# Patient Record
Sex: Female | Born: 1995 | Race: Black or African American | Hispanic: No | Marital: Single | Smoking: Never smoker
Health system: Southern US, Community
[De-identification: ages and names within clinical notes are randomized; demographics above are authoritative.]

## PROBLEM LIST (undated history)

## (undated) DIAGNOSIS — N390 Urinary tract infection, site not specified: Secondary | ICD-10-CM

## (undated) DIAGNOSIS — F419 Anxiety disorder, unspecified: Secondary | ICD-10-CM

## (undated) DIAGNOSIS — E559 Vitamin D deficiency, unspecified: Secondary | ICD-10-CM

## (undated) DIAGNOSIS — A749 Chlamydial infection, unspecified: Secondary | ICD-10-CM

## (undated) DIAGNOSIS — G4730 Sleep apnea, unspecified: Secondary | ICD-10-CM

## (undated) DIAGNOSIS — B009 Herpesviral infection, unspecified: Secondary | ICD-10-CM

## (undated) DIAGNOSIS — T7840XA Allergy, unspecified, initial encounter: Secondary | ICD-10-CM

## (undated) DIAGNOSIS — D649 Anemia, unspecified: Secondary | ICD-10-CM

## (undated) DIAGNOSIS — J45909 Unspecified asthma, uncomplicated: Secondary | ICD-10-CM

## (undated) DIAGNOSIS — F32A Depression, unspecified: Secondary | ICD-10-CM

## (undated) HISTORY — DX: Depression, unspecified: F32.A

## (undated) HISTORY — DX: Anemia, unspecified: D64.9

## (undated) HISTORY — DX: Vitamin D deficiency, unspecified: E55.9

## (undated) HISTORY — DX: Sleep apnea, unspecified: G47.30

## (undated) HISTORY — DX: Herpesviral infection, unspecified: B00.9

## (undated) HISTORY — DX: Allergy, unspecified, initial encounter: T78.40XA

## (undated) HISTORY — PX: LAPAROSCOPIC GASTRIC SLEEVE RESECTION: SHX5895

## (undated) HISTORY — DX: Chlamydial infection, unspecified: A74.9

## (undated) HISTORY — DX: Unspecified asthma, uncomplicated: J45.909

## (undated) HISTORY — PX: TONSILLECTOMY AND ADENOIDECTOMY: SUR1326

## (undated) HISTORY — DX: Anxiety disorder, unspecified: F41.9

---
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
## 2015-01-22 ENCOUNTER — Ambulatory Visit (INDEPENDENT_AMBULATORY_CARE_PROVIDER_SITE_OTHER): Payer: BLUE CROSS/BLUE SHIELD

## 2015-01-22 ENCOUNTER — Telehealth: Payer: Self-pay | Admitting: Physician Assistant

## 2015-01-22 ENCOUNTER — Ambulatory Visit (INDEPENDENT_AMBULATORY_CARE_PROVIDER_SITE_OTHER): Payer: BLUE CROSS/BLUE SHIELD | Admitting: Physician Assistant

## 2015-01-22 VITALS — BP 132/80 | HR 89 | Temp 97.9°F | Resp 18 | Ht 65.0 in | Wt 300.0 lb

## 2015-01-22 DIAGNOSIS — R0602 Shortness of breath: Secondary | ICD-10-CM

## 2015-01-22 DIAGNOSIS — Z825 Family history of asthma and other chronic lower respiratory diseases: Secondary | ICD-10-CM

## 2015-01-22 DIAGNOSIS — R0789 Other chest pain: Secondary | ICD-10-CM

## 2015-01-22 DIAGNOSIS — Z3200 Encounter for pregnancy test, result unknown: Secondary | ICD-10-CM

## 2015-01-22 LAB — POCT URINE PREGNANCY: Preg Test, Ur: NEGATIVE

## 2015-01-22 LAB — D-DIMER, QUANTITATIVE: D-Dimer, Quant: 0.27 ug/mL-FEU (ref 0.00–0.48)

## 2015-01-22 LAB — TROPONIN I: Troponin I: <0.01 ng/mL (ref <0.06)

## 2015-01-22 MED ORDER — ALBUTEROL SULFATE HFA 108 (90 BASE) MCG/ACT IN AERS: 2.0000 | INHALATION_SPRAY | RESPIRATORY_TRACT | Status: DC | PRN

## 2015-01-22 NOTE — Progress Notes (Signed)
Subjective:    Patient ID: Autumn Nunez, female    DOB: [birth year], 19 y.o.   MRN: [id number]  Chief Complaint  Patient presents with  . Chest Pain    since saturday   . Shortness of Breath   Patient Active Problem List   Diagnosis Date Noted  . Obesity, morbid, BMI 40.0-49.9 11/20/2013   Prior to Admission medications   Medication Sig Start Date End Date Taking? Authorizing Provider  etonogestrel (IMPLANON) 68 MG IMPL implant Inject 1 each into the skin once.   Yes Historical Provider, MD   Medications, allergies, past medical history, surgical history, family history, social history and problem list reviewed and updated.  HPI  19 yof with no significant pmh presents with chest tightness, sob.  Chest tightness started approx 36 hrs ago. Was gradual onset, 6/10. Has been steady since onset. When she was working last night at [employer] it worsened to 9/10. No radiation. No assoc palps, presyncope, syncope. Chest tightness worsens with deep breaths, with laughing, sneezing, etc.   Has had implanon approx 1-2 yrs. Denies recent immobilzation, unilateral leg pain, swelling. No hx clots or fam hx clots.   She has had similar chest tightness before in the spring. Thinks she may have allergies. Denies recent rhinorrhea but has had itchy/watery eyes. Has had mild non prod cough past couple wks. She has no hx asthma. Her dad has asthma. She is concerned she may also have asthma. Admits to freq sob episodes over past few months-year which last minutes at a time. Denies wheezing.   Denies hx anxiety, panic attacks. Does not feel that anxiety has anything to do with this episode. No sick contacts.   Review of Systems No fever, chills.     Objective:   Physical Exam  Constitutional: She is oriented to person, place, and time. She appears well-developed and well-nourished.  Non-toxic appearance. She does not have a sickly appearance. She does not appear ill. No distress.  BP 132/80 mmHg   Pulse 89  Temp(Src) 97.9 F (36.6 C) (Oral)  Resp 18  Ht  (1.651 m)  Wt 300 lb (136.079 kg)  BMI 49.92 kg/m2  SpO2 97%   HENT:  Right Ear: Tympanic membrane normal.  Left Ear: Tympanic membrane normal.  Nose: No mucosal edema or rhinorrhea.  Mouth/Throat: Uvula is midline and oropharynx is clear and moist. No oropharyngeal exudate, posterior oropharyngeal edema, posterior oropharyngeal erythema or tonsillar abscesses.  Cardiovascular: Normal rate, regular rhythm and normal heart sounds.  Exam reveals no gallop.   No murmur heard. Pulmonary/Chest: Effort normal and breath sounds normal. No accessory muscle usage. No tachypnea. No respiratory distress. She has no decreased breath sounds. She has no wheezes. She has no rhonchi. She has no rales. She exhibits no tenderness and no bony tenderness.  Musculoskeletal:       Right lower leg: She exhibits no tenderness and no swelling.       Left lower leg: She exhibits no tenderness and no swelling.  No leg swelling, pain, erythema.   Lymphadenopathy:       Head (right side): No submental, no submandibular and no tonsillar adenopathy present.       Head (left side): No submental, no submandibular and no tonsillar adenopathy present.    She has no cervical adenopathy.  Neurological: She is alert and oriented to person, place, and time.  Skin: Skin is warm and dry. No rash noted.  Psychiatric: She has a normal mood and  affect. Her speech is normal and behavior is normal.   Results for orders placed or performed in visit on 01/22/15  POCT urine pregnancy  Result Value Ref Range   Preg Test, Ur Negative    EKG read by Dr Patsy Lageropland. Findings: decreased voltage OW negative  [facility] reading (PRIMARY) by  Dr. Patsy Lageropland. CXR: Borderline enlarged heart size, otherwise normal.   Spirometry findings pre-neb:  FVC: 54% FEV1: 36% FEV1/FVC: 68%  Duoneb completed. Lung sounds unchanged post.   Spirometry findings post-neb: FVC: 96% FEV1:  73% FEV1/FVC: 76%     Assessment & Plan:   [age] yof with no significant pmh presents with chest tightness, sob.  Chest tightness or pressure - Plan: EKG 12-Lead, D-dimer, quantitative --doubt cardiac involvement with pts age, no fam hx, stable cp for 36 hrs with no ekg changes --> await troponin  --low risk for pe per wells criteria - as pt has implanon, has pleuritic cp, and is obese will get d-dimer --> await result --possible asthma component as below --cxr normal other than borderline enlarged heart --er instructions if worsens or no improvement  SOB (shortness of breath) - Plan: PFT PULM FXN SPIROMETRY (94010), D-dimer, quantitative Family history of asthma - Plan: PFT PULM FXN SPIROMETRY (94010) --obstructive patten on spirometry --post duoneb FEV1, FVC, FEV1/FVC all improved significantly, likely asthma --script given for albuterol inhaler prn --rtc 3-6 months for asthma management --er instructions if worsens or no improvement  Encounter for pregnancy test - Plan: POCT urine pregnancy --negative  --ok to pursue cta if needed if positive d-dimer  Donnajean Lopesodd M. Lak[place] Markgraf, PA-C Physician Assistant-Certified [facility]  01/22/2015 3:08 PM

## 2015-01-22 NOTE — Telephone Encounter (Signed)
Attempted to call pt. Left msg to call back. DDimer and troponin was normal.

## 2015-01-22 NOTE — Patient Instructions (Signed)
We are unsure of the exact cause of your chest pain and shortness of breath right now.  We drew two labs to make sure it is not a clot or anything to do with your heart, I will be in touch with you in a couple hours with those results.  Your ekg and chest xray looked normal, making heart an unlikely cause. Your breathing test showed that you may have some asthma, this may be causing your symptoms. Your breathing test was much better after the breathing treatment,.  Please use the albuterol inhaler at home up to every 4 hours as needed for shortness of breath. We would like to see you back in 3-6 months for further asthma management.  If this doesn't seem to improve your breathing please return to clinic for further evaluation. Please start taking a daily antihistamine like claritin or zyrtec in case allergies are playing a role.  Please go to the ED asap if the chest pain or breathing worsens or even persists.

## 2015-01-22 NOTE — Telephone Encounter (Signed)
Pt called back, reassuring results relayed to pt. She expressed understanding. Instructed again to go to ED if sx worsen.

## 2017-02-01 IMAGING — CR DG CHEST 2V
4 series · 4 of 4 positions shown · non-contrast
Comparison: None

CLINICAL DATA: Chest tightness or pressure, shortness of breath,
nonsmoker

EXAM:
CHEST  2 VIEW

[PA (1 of 2)]
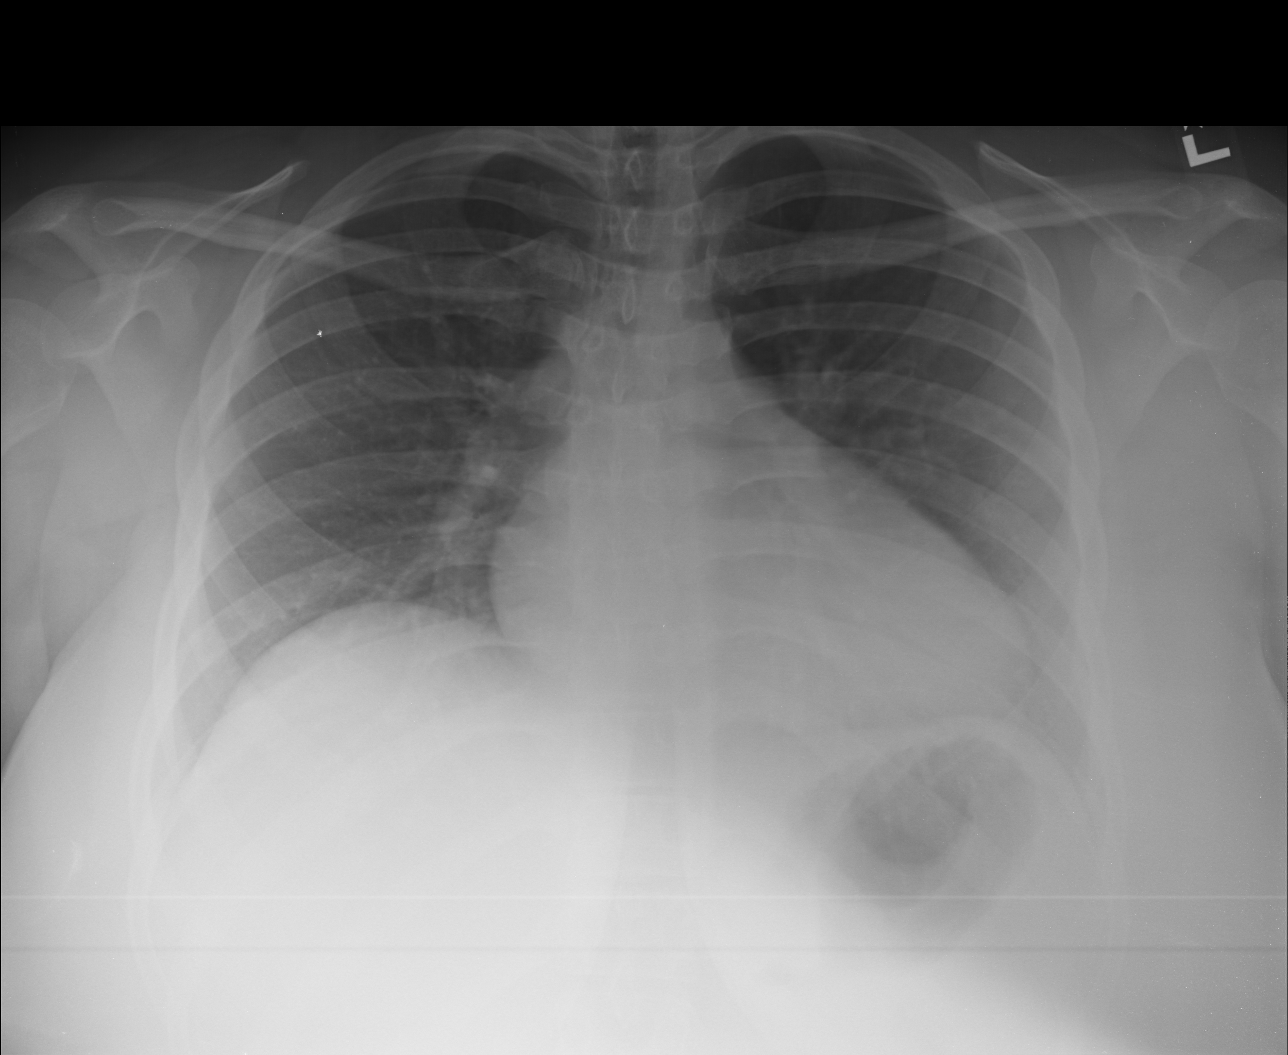

[lateral (1 of 2)]
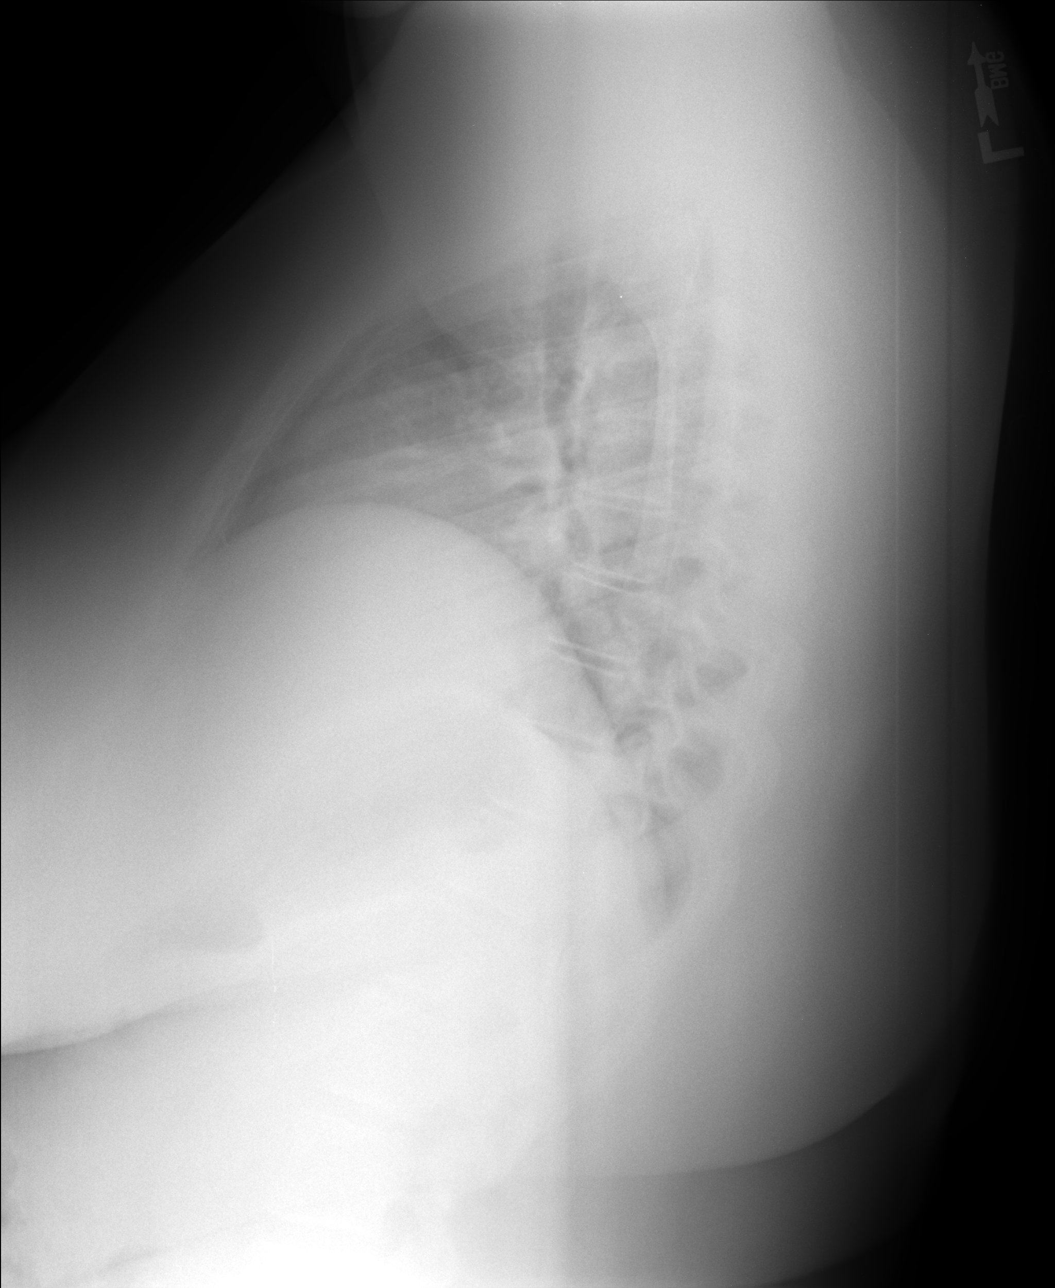

[lateral (2 of 2)]
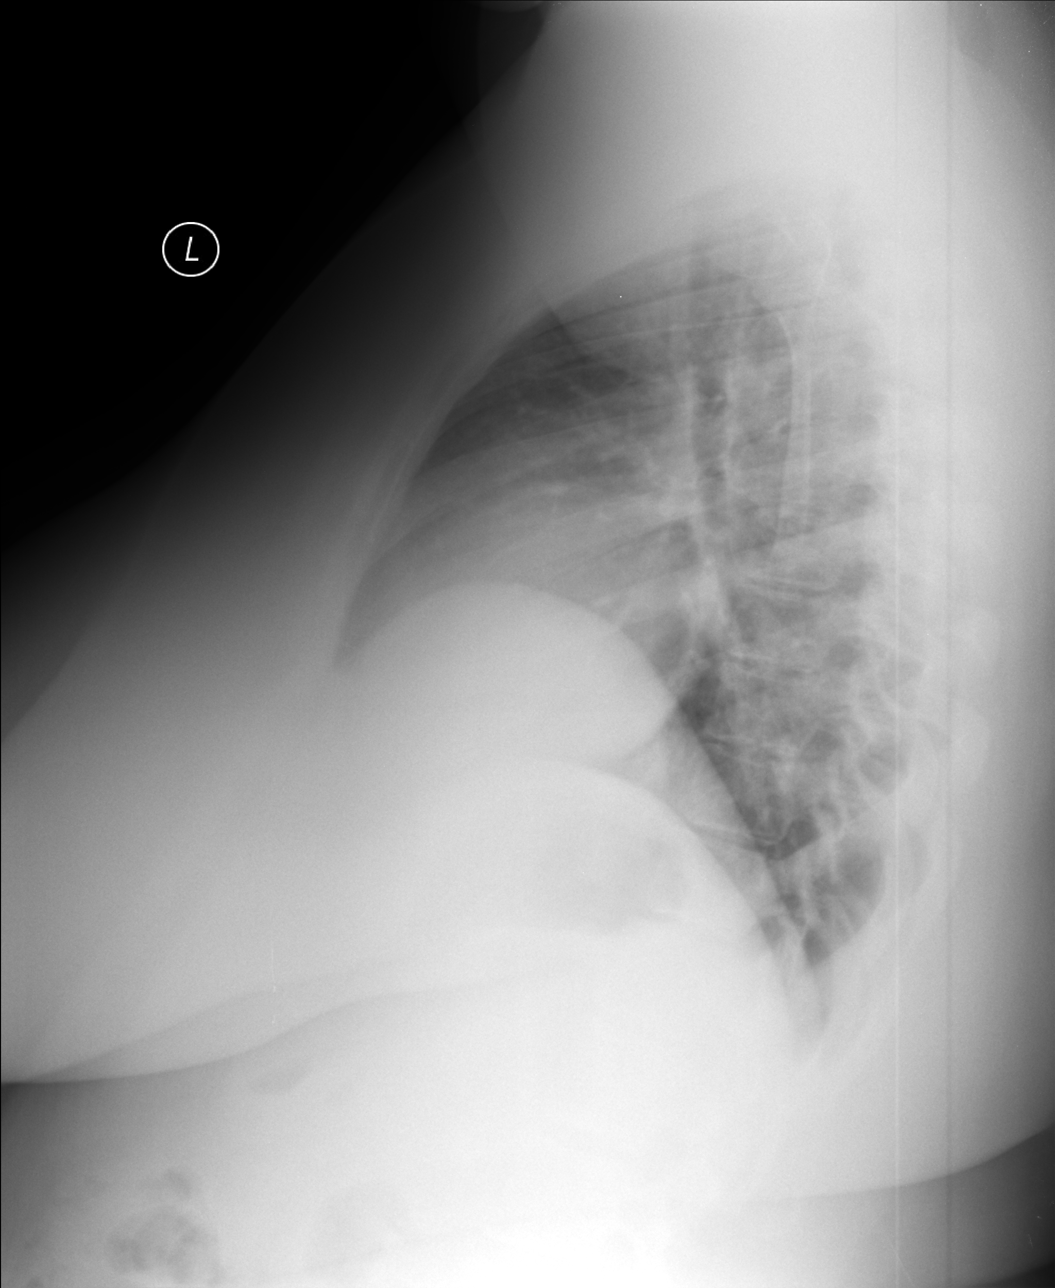

[PA (2 of 2)]
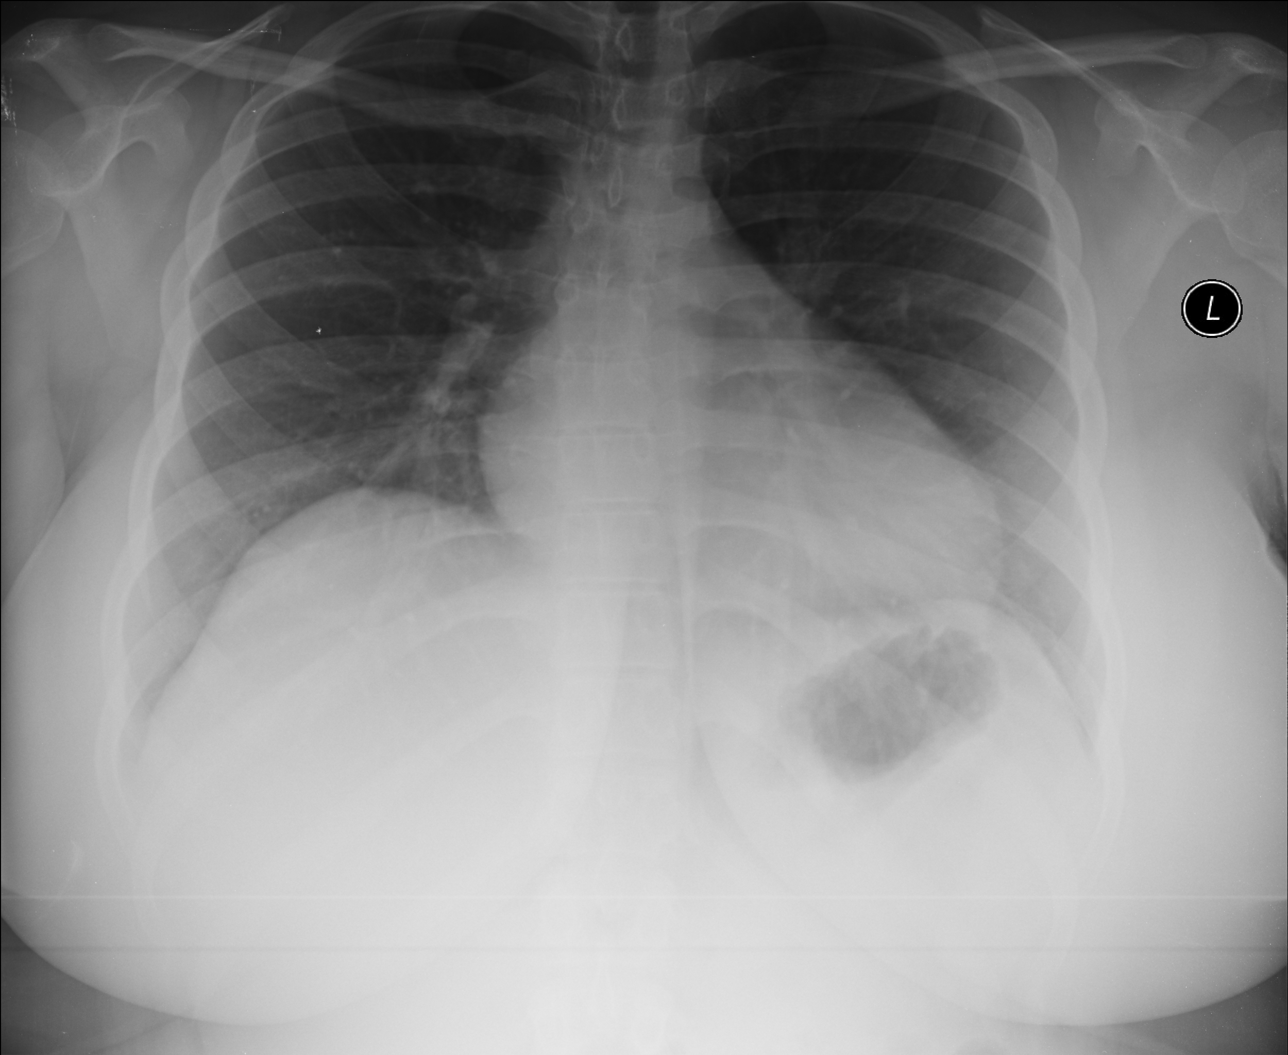

[4 of 4 positions shown; findings below may reference images not displayed]

FINDINGS: Enlargement of cardiac silhouette.

Mediastinal contours and pulmonary vascularity normal.

Lungs clear.

No pleural effusion or pneumothorax.

Bones unremarkable.
IMPRESSION: Enlargement of cardiac silhouette.

No acute abnormalities.

## 2018-02-02 ENCOUNTER — Encounter (HOSPITAL_COMMUNITY): Payer: Self-pay

## 2018-02-02 ENCOUNTER — Other Ambulatory Visit: Payer: Self-pay

## 2018-02-02 ENCOUNTER — Ambulatory Visit (HOSPITAL_COMMUNITY)
Admission: EM | Admit: 2018-02-02 | Discharge: 2018-02-02 | Disposition: A | Discharge status: Home / Self Care | Payer: Managed Care, Other (non HMO) | Attending: Family Medicine | Admitting: Family Medicine

## 2018-02-02 DIAGNOSIS — Z3202 Encounter for pregnancy test, result negative: Secondary | ICD-10-CM | POA: Diagnosis not present

## 2018-02-02 DIAGNOSIS — Z202 Contact with and (suspected) exposure to infections with a predominantly sexual mode of transmission: Secondary | ICD-10-CM | POA: Insufficient documentation

## 2018-02-02 DIAGNOSIS — Z113 Encounter for screening for infections with a predominantly sexual mode of transmission: Secondary | ICD-10-CM

## 2018-02-02 LAB — POCT PREGNANCY, URINE: Preg Test, Ur: NEGATIVE

## 2018-02-02 LAB — POCT URINALYSIS DIP (DEVICE)
BILIRUBIN URINE: NEGATIVE
GLUCOSE, UA: NEGATIVE mg/dL
KETONES UR: NEGATIVE mg/dL
Nitrite: POSITIVE — AB
Protein, ur: NEGATIVE mg/dL
Specific Gravity, Urine: 1.02 (ref 1.005–1.030)
Urobilinogen, UA: 0.2 mg/dL (ref 0.0–1.0)
pH: 6 (ref 5.0–8.0)

## 2018-02-02 NOTE — ED Triage Notes (Signed)
Pt presents with request for an STD screening to be completed. Denies any symptoms at this time.

## 2018-02-02 NOTE — ED Provider Notes (Addendum)
[facility]    CSN: [id number] Arrival date & time: 02/02/18  1010     History   Chief Complaint Chief Complaint  Patient presents with  . SEXUALLY TRANSMITTED DISEASE    HPI Autumn Nunez is a 22 y.o. female no significant past medical history presenting today requesting STD screen.  Her partner tested positive for chlamydia.  She denies any current symptoms at this time.  Denies vaginal discharge, pelvic pain, vaginal irritation or itching, denies urinary symptoms of dysuria, increased frequency, incomplete voiding.  Denies fever, abdominal pain, nausea, vomiting.  Patient has Nexplanon for birth control.  Denies any new rashes, lesions or bumps.  LMP last Monday, patient recently ended.  HPI  Past Medical History:  Diagnosis Date  . Allergy   . Recurrent UTI     Patient Active Problem List   Diagnosis Date Noted  . Obesity, morbid, BMI 40.0-49.9 (HCC) 11/20/2013    Past Surgical History:  Procedure Laterality Date  . BIRTH CONTROL IMPLANT  MARCH 2014  . CYSTO WITH HYDRODISTENSION N/A 04/08/2013   Procedure: CYSTOSCOPY/HYDRODISTENSION;  Surgeon: Kathi LudwigSigmund I Tannenbaum, MD;  [facility];  Service: Urology;  Laterality: N/A;  . CYSTOGRAM N/A 04/08/2013   Procedure: CYSTOGRAM;  Surgeon: Kathi LudwigSigmund I Tannenbaum, MD;  [facility];  Service: Urology;  Laterality: N/A;  . TONSILLECTOMY AND ADENOIDECTOMY  age 22    OB History    No data available       Home Medications    Prior to Admission medications   Medication Sig Start Date End Date Taking? Authorizing Provider  etonogestrel (IMPLANON) 68 MG IMPL implant Inject 1 each into the skin once.   Yes [provider]  albuterol (PROVENTIL HFA;VENTOLIN HFA) 108 (90 BASE) MCG/ACT inhaler Inhale 2 puffs into the lungs every 4 (four) hours as needed for wheezing or shortness of breath (cough, shortness of breath or wheezing.). 01/22/15   Raelyn EnsignMcVeigh, Todd, PA     Family History History reviewed. No pertinent family history.  Social History Social History   Tobacco Use  . Smoking status: Never Smoker  . Smokeless tobacco: Never Used  Substance Use Topics  . Alcohol use: No  . Drug use: No     Allergies   Patient has no known allergies.   Review of Systems Review of Systems  Constitutional: Negative for fever.  Respiratory: Negative for shortness of breath.   Cardiovascular: Negative for chest pain.  Gastrointestinal: Negative for abdominal pain, diarrhea, nausea and vomiting.  Genitourinary: Negative for dysuria, flank pain, genital sores, hematuria, menstrual problem, vaginal bleeding, vaginal discharge and vaginal pain.  Musculoskeletal: Negative for back pain.  Skin: Negative for rash.  Neurological: Negative for dizziness, light-headedness and headaches.     Physical Exam Triage Vital Signs ED Triage Vitals [02/02/18 1047]  Enc Vitals Group     BP (!) 148/116     Pulse Rate 83     Resp 19     Temp 98.6 F (37 C)     Temp src      SpO2 100 %     Weight      Height      Head Circumference      Peak Flow      Pain Score      Pain Loc      Pain Edu?      Excl. in GC?    No data found.  Updated Vital Signs BP (!) 148/116  Pulse 83   Temp 98.6 F (37 C)   Resp 19   LMP 02/02/2018   SpO2 100%   Visual Acuity Right Eye Distance:   Left Eye Distance:   Bilateral Distance:    Right Eye Near:   Left Eye Near:    Bilateral Near:     Physical Exam  Constitutional: She appears well-developed and well-nourished. No distress.  HENT:  Head: Normocephalic and atraumatic.  Eyes: Conjunctivae are normal.  Neck: Neck supple.  Cardiovascular: Normal rate and regular rhythm.  No murmur heard. Pulmonary/Chest: Effort normal and breath sounds normal. No respiratory distress.  Abdominal: Soft. There is no tenderness.  Genitourinary:  Genitourinary Comments: Pelvic exam deferred, patient without symptoms.   Musculoskeletal: She exhibits no edema.  Neurological: She is alert.  Skin: Skin is warm and dry.  Psychiatric: She has a normal mood and affect.  Nursing note and vitals reviewed.    UC Treatments / Results  Labs (all labs ordered are listed, but only abnormal results are displayed) Labs Reviewed  URINE CYTOLOGY ANCILLARY ONLY    EKG  EKG Interpretation None       Radiology No results found.  Procedures Procedures (including critical care time)  Medications Ordered in UC Medications - No data to display   Initial Impression / Assessment and Plan / UC Course  I have reviewed the triage vital signs and the nursing notes.  Pertinent labs & imaging results that were available during my care of the patient were reviewed by me and considered in my medical decision making (see chart for details).     Patient declines empiric treatment for chlamydia.  Will send off urine cytology, will call with any positive results and provide required medication. Discussed strict return precautions. Patient verbalized understanding and is agreeable with plan.   Final Clinical Impressions(s) / UC Diagnoses   Final diagnoses:  Screen for STD (sexually transmitted disease)    ED Discharge Orders    None       Controlled Substance Prescriptions [place] Controlled Substance Registry consulted? Not Applicable   Lew Dawes, PA-C 02/02/18 [address], [place], [place] 02/02/18 1102

## 2018-02-02 NOTE — Discharge Instructions (Signed)
We are testing you for Gonorrhea, Chlamydia, Trichomonas, Yeast and Bacterial Vaginosis. We will call you if anything is positive and let you know if you require any further treatment. Please inform partners of any positive results.   Please return if symptoms not improving with treatment, development of fever, nausea, vomiting, abdominal pain.  

## 2018-02-03 LAB — URINE CYTOLOGY ANCILLARY ONLY
Chlamydia: POSITIVE — AB
Neisseria Gonorrhea: NEGATIVE
TRICH (WINDOWPATH): NEGATIVE

## 2018-02-04 ENCOUNTER — Telehealth (HOSPITAL_COMMUNITY): Payer: Self-pay | Admitting: Family Medicine

## 2018-02-04 ENCOUNTER — Telehealth (HOSPITAL_COMMUNITY): Payer: Self-pay | Admitting: Internal Medicine

## 2018-02-04 LAB — URINE CYTOLOGY ANCILLARY ONLY: Candida vaginitis: NEGATIVE

## 2018-02-04 MED ORDER — AZITHROMYCIN 500 MG PO TABS: 1000.0000 mg | ORAL_TABLET | Freq: Once | ORAL | 0 refills | Status: AC

## 2018-02-04 NOTE — Telephone Encounter (Signed)
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  Prescription for zithromax sent to the pharmacy of record, [facility] on [address].  Please refrain from sexual intercourse for 7 days to give the medicine time to work.  Sexual partners need to be notified and tested/treated.  Condoms may reduce risk of reinfection.  Recheck for further evaluation if symptoms are not improving.   LM

## 2018-02-11 ENCOUNTER — Telehealth: Payer: Self-pay | Admitting: Internal Medicine

## 2018-10-01 ENCOUNTER — Encounter (HOSPITAL_BASED_OUTPATIENT_CLINIC_OR_DEPARTMENT_OTHER): Payer: Self-pay | Admitting: Adult Health

## 2018-10-01 ENCOUNTER — Other Ambulatory Visit: Payer: Self-pay

## 2018-10-01 ENCOUNTER — Emergency Department (HOSPITAL_BASED_OUTPATIENT_CLINIC_OR_DEPARTMENT_OTHER)
Admission: EM | Admit: 2018-10-01 | Discharge: 2018-10-01 | Disposition: A | Discharge status: Home / Self Care | Payer: Managed Care, Other (non HMO) | Attending: Emergency Medicine | Admitting: Emergency Medicine

## 2018-10-01 DIAGNOSIS — Z79899 Other long term (current) drug therapy: Secondary | ICD-10-CM | POA: Insufficient documentation

## 2018-10-01 DIAGNOSIS — L539 Erythematous condition, unspecified: Secondary | ICD-10-CM | POA: Diagnosis present

## 2018-10-01 DIAGNOSIS — L739 Follicular disorder, unspecified: Secondary | ICD-10-CM

## 2018-10-01 MED ORDER — DOXYCYCLINE HYCLATE 100 MG PO CAPS: 100.0000 mg | ORAL_CAPSULE | Freq: Two times a day (BID) | ORAL | 0 refills | Status: DC

## 2018-10-01 MED FILL — DOXYCYCLINE HYCLATE 100 MG: 100 | 7 days supply | Qty: 14 | Fill #0

## 2018-10-01 NOTE — ED Triage Notes (Addendum)
PResents with pustules and induration under bilateral axilla that have been ongoing for "Awhile" a few days ago she began developing pustules and bumps to breast, no induration noted. Denies drainage from nipples. Denies fevers.

## 2018-10-01 NOTE — ED Provider Notes (Signed)
[facility] EMERGENCY DEPARTMENT Provider Note   CSN: [id number] Arrival date & time: 10/01/18  1521     History   Chief Complaint Chief Complaint  Patient presents with  . Recurrent Skin Infections    HPI Autumn Nunez is a 22 y.o. female.  Autumn Nunez is a 22 y.o. Female who presents to the emergency department for evaluation of redness and pustules under her bilateral axillae that is been going on for a long time, as well as some redness and warmth noted to the right areole.  Patient reports that she has had issues with bumps in her armpits but for years now has never formally seen a doctor about them they frequently drain or improve on their own.  She reports several small ones in the right axilla, some of which drained last night, the left is actually significantly improved from usual, but she noticed some bumps over the hair follicles on her right areole on her breast and was concerned about this.  She has not noticed any drainage from these areas and the inflammation seems much more mild compared to that in her axilla.  She is not felt any masses in the breast.  She has not had any nipple discharge.  She is not currently breast-feeding.  No fevers or chills, no nausea or vomiting.  She has not done anything for symptoms prior to arrival, and denies any other aggravating or alleviating factors.     Past Medical History:  Diagnosis Date  . Allergy   . Recurrent UTI     Patient Active Problem List   Diagnosis Date Noted  . Obesity, morbid, BMI 40.0-49.9 (HCC) 11/20/2013    Past Surgical History:  Procedure Laterality Date  . BIRTH CONTROL IMPLANT  MARCH 2014  . CYSTO WITH HYDRODISTENSION N/A 04/08/2013   Procedure: CYSTOSCOPY/HYDRODISTENSION;  Surgeon: Kathi Ludwig, MD;  Location: [facility];  Service: Urology;  Laterality: N/A;  . CYSTOGRAM N/A 04/08/2013   Procedure: CYSTOGRAM;  Surgeon: Kathi Ludwig, MD;  [facility];  Service: Urology;  Laterality: N/A;  . TONSILLECTOMY AND ADENOIDECTOMY  age 22     OB History   None      Home Medications    Prior to Admission medications   Medication Sig Start Date End Date Taking? Authorizing Provider  albuterol (PROVENTIL HFA;VENTOLIN HFA) 108 (90 BASE) MCG/ACT inhaler Inhale 2 puffs into the lungs every 4 (four) hours as needed for wheezing or shortness of breath (cough, shortness of breath or wheezing.). 01/22/15   McVeigh, Tawanna Cooler, PA  doxycycline (VIBRAMYCIN) 100 MG capsule Take 1 capsule (100 mg total) by mouth 2 (two) times daily. One po bid x 7 days 10/01/18   Dartha Lodge, PA-C  etonogestrel (IMPLANON) 68 MG IMPL implant Inject 1 each into the skin once.    [provider]    Family History History reviewed. No pertinent family history.  Social History Social History   Tobacco Use  . Smoking status: Never Smoker  . Smokeless tobacco: Never Used  Substance Use Topics  . Alcohol use: No  . Drug use: No     Allergies   Patient has no known allergies.   Review of Systems Review of Systems  Constitutional: Negative for chills and fever.  Gastrointestinal: Negative for nausea and vomiting.  Skin: Positive for color change.     Physical Exam Updated Vital Signs BP (!) 152/79   Pulse 80  Temp 98.7 F (37.1 C) (Oral)   Resp 18   Ht 5\' 4"  (1.626 m)   Wt 127 kg   LMP 09/19/2018   SpO2 99%   BMI 48.06 kg/m   Physical Exam  Constitutional: She appears well-developed and well-nourished. No distress.  HENT:  Head: Normocephalic and atraumatic.  Eyes: Right eye exhibits no discharge. Left eye exhibits no discharge.  Pulmonary/Chest: Effort normal. No respiratory distress.  Musculoskeletal:  Several small pustules noted in bilateral axilla, left more severe than right, none large enough to warrant drainage, there is some surrounding erythema and induration with no areas of significant fluctuance. There is  some erythema and induration surrounding the hair follicles of the right areole on the right breast no palpable fluctuance or expressible drainage, no palpable breast masses, no nipple drainage or erythema.  Neurological: She is alert. Coordination normal.  Skin: Skin is warm and dry. Capillary refill takes less than 2 seconds. She is not diaphoretic.  Psychiatric: She has a normal mood and affect. Her behavior is normal.  Nursing note and vitals reviewed.    ED Treatments / Results  Labs (all labs ordered are listed, but only abnormal results are displayed) Labs Reviewed - No data to display  EKG None  Radiology No results found.  Procedures Procedures (including critical care time)  Medications Ordered in ED Medications - No data to display   Initial Impression / Assessment and Plan / ED Course  I have reviewed the triage vital signs and the nursing notes.  Pertinent labs & imaging results that were available during my care of the patient were reviewed by me and considered in my medical decision making (see chart for details).  Patient presents to the emergency department for evaluation of pustules erythema and induration in bilateral axilla as well as some erythema and induration over the health follicles on the right areaola.  She has had issues with this in her armpits for a long time but the area over her breast she just noticed a few days ago.  On exam this seems most consistent with folliculitis there are no areas of fluctuance or areas that appear large enough to require drainage.  Will treat with doxycycline and have patient follow-up closely with her primary care doctor.  She has no systemic symptoms and is well-appearing.  There is no palpable breast abscess.  Return precautions have been discussed.  Patient expresses understanding and is in agreement with plan.  Final Clinical Impressions(s) / ED Diagnoses   Final diagnoses:  Folliculitis    ED Discharge Orders          Ordered    doxycycline (VIBRAMYCIN) 100 MG capsule  2 times daily     10/01/18 1548           Jodi Geralds [place], [place] 10/01/18 1845    Loren Racer, MD 10/01/18 [phone]

## 2018-10-01 NOTE — Discharge Instructions (Signed)
Please take antibiotics as directed, keep the areas clean with gentle soaps you can also use warm compresses to help with discomfort, monitor the area for any worsening signs of infection such as increasing redness, swelling, warmth or drainage, fevers or chills or any other new or concerning symptoms.  If this is not resolving please follow-up with your primary care doctor or return to the emergency department.

## 2018-10-04 ENCOUNTER — Encounter (HOSPITAL_BASED_OUTPATIENT_CLINIC_OR_DEPARTMENT_OTHER): Payer: Self-pay | Admitting: *Deleted

## 2018-10-04 ENCOUNTER — Emergency Department (HOSPITAL_BASED_OUTPATIENT_CLINIC_OR_DEPARTMENT_OTHER)
Admission: EM | Admit: 2018-10-04 | Discharge: 2018-10-04 | Disposition: A | Discharge status: Home / Self Care | Payer: Managed Care, Other (non HMO) | Attending: Emergency Medicine | Admitting: Emergency Medicine

## 2018-10-04 ENCOUNTER — Other Ambulatory Visit: Payer: Self-pay

## 2018-10-04 DIAGNOSIS — Z79899 Other long term (current) drug therapy: Secondary | ICD-10-CM | POA: Diagnosis not present

## 2018-10-04 DIAGNOSIS — G43809 Other migraine, not intractable, without status migrainosus: Secondary | ICD-10-CM | POA: Insufficient documentation

## 2018-10-04 DIAGNOSIS — R51 Headache: Secondary | ICD-10-CM | POA: Diagnosis present

## 2018-10-04 LAB — COMPREHENSIVE METABOLIC PANEL
ALBUMIN: 3.3 g/dL — AB (ref 3.5–5.0)
ALK PHOS: 67 U/L (ref 38–126)
ALT: 14 U/L (ref 0–44)
AST: 14 U/L — AB (ref 15–41)
Anion gap: 8 (ref 5–15)
BUN: 13 mg/dL (ref 6–20)
CALCIUM: 8.9 mg/dL (ref 8.9–10.3)
CO2: 25 mmol/L (ref 22–32)
Chloride: 105 mmol/L (ref 98–111)
Creatinine, Ser: 0.64 mg/dL (ref 0.44–1.00)
GFR calc Af Amer: >60 mL/min (ref >60)
GFR calc non Af Amer: >60 mL/min (ref >60)
GLUCOSE: 85 mg/dL (ref 70–99)
Potassium: 3.5 mmol/L (ref 3.5–5.1)
SODIUM: 138 mmol/L (ref 135–145)
Total Bilirubin: 0.3 mg/dL (ref 0.3–1.2)
Total Protein: 7.3 g/dL (ref 6.5–8.1)

## 2018-10-04 LAB — PREGNANCY, URINE: Preg Test, Ur: NEGATIVE

## 2018-10-04 LAB — CBC WITH DIFFERENTIAL/PLATELET
ABS IMMATURE GRANULOCYTES: 0.01 10*3/uL (ref 0.00–0.07)
BASOS ABS: 0 10*3/uL (ref 0.0–0.1)
BASOS PCT: 0 %
Eosinophils Absolute: 0.2 10*3/uL (ref 0.0–0.5)
Eosinophils Relative: 3 %
HCT: 35 % — ABNORMAL LOW (ref 36.0–46.0)
HEMOGLOBIN: 10.3 g/dL — AB (ref 12.0–15.0)
Immature Granulocytes: 0 %
LYMPHS PCT: 52 %
Lymphs Abs: 3.9 10*3/uL (ref 0.7–4.0)
MCH: 23.9 pg — ABNORMAL LOW (ref 26.0–34.0)
MCHC: 29.4 g/dL — AB (ref 30.0–36.0)
MCV: 81.2 fL (ref 80.0–100.0)
Monocytes Absolute: 0.6 10*3/uL (ref 0.1–1.0)
Monocytes Relative: 8 %
NEUTROS ABS: 2.7 10*3/uL (ref 1.7–7.7)
NEUTROS PCT: 37 %
Platelets: 376 10*3/uL (ref 150–400)
RBC: 4.31 MIL/uL (ref 3.87–5.11)
RDW: 15.8 % — ABNORMAL HIGH (ref 11.5–15.5)
WBC: 7.5 10*3/uL (ref 4.0–10.5)
nRBC: 0 % (ref 0.0–0.2)

## 2018-10-04 MED ORDER — LACTATED RINGERS IV BOLUS
1000.0000 mL | Freq: Once | INTRAVENOUS | Status: AC
  Administered 2018-10-04: 1000 mL via INTRAVENOUS

## 2018-10-04 MED ORDER — DEXAMETHASONE SODIUM PHOSPHATE 10 MG/ML IJ SOLN
10.0000 mg | Freq: Once | INTRAMUSCULAR | Status: AC
  Administered 2018-10-04: 10 mg via INTRAVENOUS
  Filled 2018-10-04: qty 1

## 2018-10-04 MED ORDER — DIPHENHYDRAMINE HCL 50 MG/ML IJ SOLN
25.0000 mg | Freq: Once | INTRAMUSCULAR | Status: AC
  Administered 2018-10-04: 25 mg via INTRAVENOUS
  Filled 2018-10-04: qty 1

## 2018-10-04 MED ORDER — ONDANSETRON HCL 4 MG PO TABS: 4.0000 mg | ORAL_TABLET | Freq: Three times a day (TID) | ORAL | 0 refills | Status: DC | PRN

## 2018-10-04 MED ORDER — PROCHLORPERAZINE EDISYLATE 10 MG/2ML IJ SOLN
10.0000 mg | Freq: Once | INTRAMUSCULAR | Status: AC
  Administered 2018-10-04: 10 mg via INTRAVENOUS
  Filled 2018-10-04: qty 2

## 2018-10-04 NOTE — ED Provider Notes (Signed)
[facility] EMERGENCY DEPARTMENT Provider Note   CSN: [id number] Arrival date & time: 10/04/18  2015     History   Chief Complaint Chief Complaint  Patient presents with  . Headache  . Emesis    HPI Autumn Nunez is a 22 y.o. female.  The history is provided by the patient.  Headache   This is a new problem. The current episode started yesterday. The problem occurs hourly. The problem has been gradually worsening. The headache is associated with emotional stress. The pain is located in the bilateral region. The quality of the pain is described as dull. The pain is at a severity of 4/10. The pain is moderate. The pain does not radiate. Associated symptoms include nausea and vomiting. Pertinent negatives include no anorexia, no fever, no chest pressure, no near-syncope, no orthopnea, no palpitations, no syncope and no shortness of breath. She has tried nothing for the symptoms. The treatment provided no relief.    Past Medical History:  Diagnosis Date  . Allergy   . Recurrent UTI     Patient Active Problem List   Diagnosis Date Noted  . Obesity, morbid, BMI 40.0-49.9 (HCC) 11/20/2013    Past Surgical History:  Procedure Laterality Date  . BIRTH CONTROL IMPLANT  MARCH 2014  . CYSTO WITH HYDRODISTENSION N/A 04/08/2013   Procedure: CYSTOSCOPY/HYDRODISTENSION;  Surgeon: Kathi Ludwig, MD;  Location: [facility];  Service: Urology;  Laterality: N/A;  . CYSTOGRAM N/A 04/08/2013   Procedure: CYSTOGRAM;  Surgeon: Kathi Ludwig, MD;  Location: [facility];  Service: Urology;  Laterality: N/A;  . TONSILLECTOMY AND ADENOIDECTOMY  age 18     OB History   None      Home Medications    Prior to Admission medications   Medication Sig Start Date End Date Taking? Authorizing Provider  albuterol (PROVENTIL HFA;VENTOLIN HFA) 108 (90 BASE) MCG/ACT inhaler Inhale 2 puffs into the lungs every 4 (four) hours as needed for wheezing or  shortness of breath (cough, shortness of breath or wheezing.). 01/22/15  Yes McVeigh, Tawanna Cooler, PA  doxycycline (VIBRAMYCIN) 100 MG capsule Take 1 capsule (100 mg total) by mouth 2 (two) times daily. One po bid x 7 days 10/01/18  Yes Dartha Lodge, PA-C  etonogestrel (IMPLANON) 68 MG IMPL implant Inject 1 each into the skin once.   Yes [provider]  ondansetron (ZOFRAN) 4 MG tablet Take 1 tablet (4 mg total) by mouth every 8 (eight) hours as needed for nausea or vomiting. 10/04/18   Virgina Norfolk, DO    Family History No family history on file.  Social History Social History   Tobacco Use  . Smoking status: Never Smoker  . Smokeless tobacco: Never Used  Substance Use Topics  . Alcohol use: No  . Drug use: No     Allergies   Patient has no known allergies.   Review of Systems Review of Systems  Constitutional: Negative for chills and fever.  HENT: Negative for ear pain and sore throat.   Eyes: Negative for pain and visual disturbance.  Respiratory: Negative for cough and shortness of breath.   Cardiovascular: Negative for chest pain, palpitations, orthopnea, syncope and near-syncope.  Gastrointestinal: Positive for nausea and vomiting. Negative for abdominal pain and anorexia.  Genitourinary: Negative for dysuria and hematuria.  Musculoskeletal: Negative for arthralgias and back pain.  Skin: Negative for color change and rash.  Neurological: Positive for headaches. Negative for seizures and syncope.  All  other systems reviewed and are negative.    Physical Exam Updated Vital Signs  ED Triage Vitals  Enc Vitals Group     BP 10/04/18 2029 (!) 157/95     Pulse Rate 10/04/18 2029 66     Resp 10/04/18 2029 18     Temp 10/04/18 2029 98.3 F (36.8 C)     Temp Source 10/04/18 2029 Oral     SpO2 10/04/18 2029 100 %     Weight 10/04/18 2029 279 lb 15.8 oz (127 kg)     Height 10/04/18 2029 5\' 4"  (1.626 m)     Head Circumference --      Peak Flow --      Pain Score  10/04/18 2028 9     Pain Loc --      Pain Edu? --      Excl. in GC? --     Physical Exam  Constitutional: She is oriented to person, place, and time. She appears well-developed and well-nourished. No distress.  HENT:  Head: Normocephalic and atraumatic.  Mouth/Throat: Oropharynx is clear and moist.  Eyes: Pupils are equal, round, and reactive to light. Conjunctivae and EOM are normal. Right eye exhibits normal extraocular motion and no nystagmus. Left eye exhibits normal extraocular motion and no nystagmus.  Neck: Normal range of motion. Neck supple.  Cardiovascular: Normal rate, regular rhythm, normal heart sounds and intact distal pulses.  No murmur heard. Pulmonary/Chest: Effort normal and breath sounds normal. No stridor. No respiratory distress. She has no wheezes.  Abdominal: Soft. There is no tenderness.  Musculoskeletal: Normal range of motion. She exhibits no edema.  Neurological: She is alert and oriented to person, place, and time. She has normal strength. No cranial nerve deficit or sensory deficit. Coordination and gait normal.  5+/5 strength, normal sensation, no drift, normal gait.   Skin: Skin is warm and dry. Capillary refill takes less than 2 seconds.  Psychiatric: She has a normal mood and affect.  Nursing note and vitals reviewed.    ED Treatments / Results  Labs (all labs ordered are listed, but only abnormal results are displayed) Labs Reviewed  CBC WITH DIFFERENTIAL/PLATELET - Abnormal; Notable for the following components:      Result Value   Hemoglobin 10.3 (*)    HCT 35.0 (*)    MCH 23.9 (*)    MCHC 29.4 (*)    RDW 15.8 (*)    All other components within normal limits  COMPREHENSIVE METABOLIC PANEL - Abnormal; Notable for the following components:   Albumin 3.3 (*)    AST 14 (*)    All other components within normal limits  PREGNANCY, URINE    EKG None  Radiology No results found.  Procedures Procedures (including critical care  time)  Medications Ordered in ED Medications  prochlorperazine (COMPAZINE) injection 10 mg (10 mg Intravenous Given 10/04/18 2220)  dexamethasone (DECADRON) injection 10 mg (10 mg Intravenous Given 10/04/18 2217)  diphenhydrAMINE (BENADRYL) injection 25 mg (25 mg Intravenous Given 10/04/18 2215)  lactated ringers bolus 1,000 mL ( Intravenous Stopped 10/04/18 2316)     Initial Impression / Assessment and Plan / ED Course  I have reviewed the triage vital signs and the nursing notes.  Pertinent labs & imaging results that were available during my care of the patient were reviewed by me and considered in my medical decision making (see chart for details).     Autumn Nunez is a 22 year old female w/ no significant medical history who  presents to the ED with headache.  Patient with normal vitals.  No fever.  Patient with symptoms for the last 2 days.  Headache is gradual in onset.  Has developed some nausea and vomiting.  No abdominal pain.  No neck stiffness, no fever.  History of small headaches in the past, slightly worse than normal for headaches.  Normal neurological exam.  No signs to suggest meningitis.  Patient states that she has been under a lot of stress in school and has not been sleeping well.  Likely the source of her headache.  No concern for intracranial process.  History and physical is not consistent with subarachnoid hemorrhage.  Patient had a negative pregnancy test.  Unremarkable labs overall.  Was given headache cocktail Compazine Decadron, Benadryl, fluids and headache fully improved.  Recommend increased sleep, good nutrition and follow-up with primary care doctor.  Told to return to ED if symptoms worsen.  Suspect patient likely with tension headache versus migraine headache.  Final Clinical Impressions(s) / ED Diagnoses   Final diagnoses:  Other migraine without status migrainosus, not intractable    ED Discharge Orders         Ordered    ondansetron (ZOFRAN) 4 MG  tablet  Every 8 hours PRN     10/04/18 2251           Virgina Norfolk, DO 10/04/18 2356

## 2018-10-04 NOTE — ED Notes (Signed)
C/o lower back pain,  Ha, vomiting x 3 and being tired x 1 day  Denies abd pain

## 2018-10-04 NOTE — ED Triage Notes (Signed)
Headache and vomiting since this am. Fatigue.

## 2018-12-07 ENCOUNTER — Emergency Department (HOSPITAL_BASED_OUTPATIENT_CLINIC_OR_DEPARTMENT_OTHER)
Admission: EM | Admit: 2018-12-07 | Discharge: 2018-12-07 | Disposition: A | Discharge status: Home / Self Care | Payer: Managed Care, Other (non HMO) | Attending: Emergency Medicine | Admitting: Emergency Medicine

## 2018-12-07 ENCOUNTER — Other Ambulatory Visit: Payer: Self-pay

## 2018-12-07 ENCOUNTER — Encounter (HOSPITAL_BASED_OUTPATIENT_CLINIC_OR_DEPARTMENT_OTHER): Payer: Self-pay

## 2018-12-07 ENCOUNTER — Emergency Department (HOSPITAL_BASED_OUTPATIENT_CLINIC_OR_DEPARTMENT_OTHER): Payer: Managed Care, Other (non HMO)

## 2018-12-07 DIAGNOSIS — R0981 Nasal congestion: Secondary | ICD-10-CM | POA: Diagnosis present

## 2018-12-07 DIAGNOSIS — J111 Influenza due to unidentified influenza virus with other respiratory manifestations: Secondary | ICD-10-CM | POA: Insufficient documentation

## 2018-12-07 DIAGNOSIS — Z79899 Other long term (current) drug therapy: Secondary | ICD-10-CM | POA: Diagnosis not present

## 2018-12-07 DIAGNOSIS — R6889 Other general symptoms and signs: Secondary | ICD-10-CM

## 2018-12-07 LAB — URINALYSIS, ROUTINE W REFLEX MICROSCOPIC
BILIRUBIN URINE: NEGATIVE
GLUCOSE, UA: NEGATIVE mg/dL
Ketones, ur: NEGATIVE mg/dL
Leukocytes, UA: NEGATIVE
Nitrite: NEGATIVE
PROTEIN: 30 mg/dL — AB
Specific Gravity, Urine: 1.025 (ref 1.005–1.030)
pH: 6 (ref 5.0–8.0)

## 2018-12-07 LAB — URINALYSIS, MICROSCOPIC (REFLEX)

## 2018-12-07 LAB — PREGNANCY, URINE: Preg Test, Ur: NEGATIVE

## 2018-12-07 MED ORDER — FLUTICASONE PROPIONATE 50 MCG/ACT NA SUSP: 1.0000 | Freq: Every day | NASAL | 0 refills | Status: DC

## 2018-12-07 MED ORDER — IBUPROFEN 800 MG PO TABS
800.0000 mg | ORAL_TABLET | Freq: Once | ORAL | Status: AC
  Administered 2018-12-07: 800 mg via ORAL
  Filled 2018-12-07: qty 1

## 2018-12-07 MED ORDER — OSELTAMIVIR PHOSPHATE 75 MG PO CAPS: 75.0000 mg | ORAL_CAPSULE | Freq: Two times a day (BID) | ORAL | 0 refills | Status: DC

## 2018-12-07 MED ORDER — NAPROXEN 500 MG PO TABS: 500.0000 mg | ORAL_TABLET | Freq: Two times a day (BID) | ORAL | 0 refills | Status: DC

## 2018-12-07 MED ORDER — ACETAMINOPHEN 325 MG PO TABS
325.0000 mg | ORAL_TABLET | Freq: Once | ORAL | Status: AC
  Administered 2018-12-07: 325 mg via ORAL
  Filled 2018-12-07: qty 1

## 2018-12-07 MED ORDER — BENZONATATE 100 MG PO CAPS: 100.0000 mg | ORAL_CAPSULE | Freq: Three times a day (TID) | ORAL | 0 refills | Status: DC

## 2018-12-07 MED ORDER — ACETAMINOPHEN 325 MG PO TABS
650.0000 mg | ORAL_TABLET | Freq: Once | ORAL | Status: AC | PRN
  Administered 2018-12-07: 650 mg via ORAL
  Filled 2018-12-07: qty 2

## 2018-12-07 NOTE — Discharge Instructions (Addendum)
You were seen in the emergency today for upper respiratory symptoms, we suspect your symptoms may be due to the flu.  Your chest xray did not show pneumonia. Your urine did not show a UTI. We have prescribed you multiple medications to treat your symptoms.   -Flonase to be used 1 spray in each nostril daily.  This medication is used to treat your congestion.  -Tessalon can be taken once every 8 hours as needed.  This medication is used to treat your cough.  -- Naproxen is a nonsteroidal anti-inflammatory medication that will help with pain and swelling. Be sure to take this medication as prescribed with food, 1 pill every 12 hours,  It should be taken with food, as it can cause stomach upset, and more seriously, stomach bleeding. Do not take other nonsteroidal anti-inflammatory medications with this such as Advil, Motrin, Aleve, Mobic, Goodie Powder, or Motrin.    You make take Tylenol per over the counter dosing with these medications.   We have also prescribed you tamiflu- this is a medicine to treat the flu- as discussed there are potential benefits/side effects to this- you may choose to take it or not, please start by this evening if you plan to take this medicine.   We have prescribed you new medication(s) today. Discuss the medications prescribed today with your pharmacist as they can have adverse effects and interactions with your other medicines including over the counter and prescribed medications. Seek medical evaluation if you start to experience new or abnormal symptoms after taking one of these medicines, seek care immediately if you start to experience difficulty breathing, feeling of your throat closing, facial swelling, or rash as these could be indications of a more serious allergic reaction  You will need to follow-up with your primary care provider in 1 week if your symptoms have not improved.  If you do not have a primary care provider one is provided in your discharge  instructions.  Return to the emergency department for any new or worsening symptoms including but not limited to persistent fever for 5 days, difficulty breathing, chest pain, rashes, passing out, or any other concerns.

## 2018-12-07 NOTE — ED Notes (Signed)
C/o body aches fever chills congestion, cough onset last pm

## 2018-12-07 NOTE — ED Provider Notes (Signed)
[facility] EMERGENCY DEPARTMENT Provider Note   CSN: [id number] Arrival date & time: 12/07/18  1239     History   Chief Complaint Chief Complaint  Patient presents with  . Cough    HPI Autumn Nunez is a 23 y.o. female with history of obesity who presents to the emergency department with flulike symptoms since yesterday.  Patient reports congestion, rhinorrhea, ear pain bilaterally, sore throat, dry cough, fever, chills, and generalized body aches.  No alleviating or aggravating factors.  Tried Motrin last night without relief.  Denies vomiting, diarrhea, chest pain, shortness of breath, or abdominal pain.  Denies chance of pregnancy.  HPI  Past Medical History:  Diagnosis Date  . Allergy   . Recurrent UTI     Patient Active Problem List   Diagnosis Date Noted  . Obesity, morbid, BMI 40.0-49.9 (HCC) 11/20/2013    Past Surgical History:  Procedure Laterality Date  . BIRTH CONTROL IMPLANT  MARCH 2014  . CYSTO WITH HYDRODISTENSION N/A 04/08/2013   Procedure: CYSTOSCOPY/HYDRODISTENSION;  Surgeon: Kathi Ludwig, MD;  Location: [facility];  Service: Urology;  Laterality: N/A;  . CYSTOGRAM N/A 04/08/2013   Procedure: CYSTOGRAM;  Surgeon: Kathi Ludwig, MD;  Location: [facility];  Service: Urology;  Laterality: N/A;  . TONSILLECTOMY AND ADENOIDECTOMY  age 15     OB History   No obstetric history on file.      Home Medications    Prior to Admission medications   Medication Sig Start Date End Date Taking? Authorizing Provider  albuterol (PROVENTIL HFA;VENTOLIN HFA) 108 (90 BASE) MCG/ACT inhaler Inhale 2 puffs into the lungs every 4 (four) hours as needed for wheezing or shortness of breath (cough, shortness of breath or wheezing.). 01/22/15   McVeigh, Tawanna Cooler, PA  doxycycline (VIBRAMYCIN) 100 MG capsule Take 1 capsule (100 mg total) by mouth 2 (two) times daily. One po bid x 7 days 10/01/18   Dartha Lodge, PA-C    etonogestrel (IMPLANON) 68 MG IMPL implant Inject 1 each into the skin once.    [provider]  ondansetron (ZOFRAN) 4 MG tablet Take 1 tablet (4 mg total) by mouth every 8 (eight) hours as needed for nausea or vomiting. 10/04/18   Virgina Norfolk, DO    Family History History reviewed. No pertinent family history.  Social History Social History   Tobacco Use  . Smoking status: Never Smoker  . Smokeless tobacco: Never Used  Substance Use Topics  . Alcohol use: No  . Drug use: No     Allergies   Patient has no known allergies.   Review of Systems Review of Systems  Constitutional: Positive for chills and fever.  HENT: Positive for congestion, ear pain and sore throat.   Respiratory: Positive for cough. Negative for shortness of breath.   Cardiovascular: Negative for chest pain.  Gastrointestinal: Negative for abdominal pain, diarrhea and vomiting.  Musculoskeletal: Positive for myalgias.     Physical Exam Updated Vital Signs BP 130/88 (BP Location: Left Arm)   Pulse (!) 136   Temp (!) 101.8 F (38.8 C) (Oral)   Resp 20   Ht 5\' 4"  (1.626 m)   Wt 132.9 kg   LMP 11/23/2018   SpO2 96%   BMI 50.29 kg/m   Physical Exam Vitals signs and nursing note reviewed.  Constitutional:      General: She is not in acute distress.    Appearance: She is well-developed.  HENT:  Head: Normocephalic and atraumatic.     Right Ear: Tympanic membrane, ear canal and external ear normal. Tympanic membrane is not perforated, erythematous, retracted or bulging.     Left Ear: Tympanic membrane, ear canal and external ear normal. Tympanic membrane is not perforated, erythematous, retracted or bulging.     Nose: Mucosal edema present.     Right Sinus: No maxillary sinus tenderness or frontal sinus tenderness.     Left Sinus: No maxillary sinus tenderness or frontal sinus tenderness.     Mouth/Throat:     Pharynx: Uvula midline. Posterior oropharyngeal erythema (mild) present.  No oropharyngeal exudate.     Comments: Posterior oropharynx is symmetric appearing. Patient tolerating own secretions without difficulty. No trismus. No drooling. No hot potato voice. No swelling beneath the tongue, submandibular compartment is soft.  Eyes:     General:        Right eye: No discharge.        Left eye: No discharge.     Conjunctiva/sclera: Conjunctivae normal.     Pupils: Pupils are equal, round, and reactive to light.  Neck:     Musculoskeletal: Normal range of motion and neck supple. No edema or neck rigidity.  Cardiovascular:     Rate and Rhythm: Regular rhythm. Tachycardia present.     Heart sounds: No murmur.  Pulmonary:     Effort: No respiratory distress.     Breath sounds: Normal breath sounds. No wheezing or rales.  Abdominal:     General: There is no distension.     Palpations: Abdomen is soft.     Tenderness: There is no abdominal tenderness.  Lymphadenopathy:     Cervical: No cervical adenopathy.  Skin:    General: Skin is warm and dry.     Findings: No rash.  Neurological:     Mental Status: She is alert.  Psychiatric:        Behavior: Behavior normal.      ED Treatments / Results  Labs (all labs ordered are listed, but only abnormal results are displayed) Labs Reviewed  URINALYSIS, ROUTINE W REFLEX MICROSCOPIC - Abnormal; Notable for the following components:      Result Value   Hgb urine dipstick SMALL (*)    Protein, ur 30 (*)    All other components within normal limits  URINALYSIS, MICROSCOPIC (REFLEX) - Abnormal; Notable for the following components:   Bacteria, UA FEW (*)    All other components within normal limits  PREGNANCY, URINE    EKG None  Radiology Dg Chest 2 View  Result Date: 12/07/2018 CLINICAL DATA:  Cough and congestion. EXAM: CHEST - 2 VIEW COMPARISON:  01/22/2015. FINDINGS: Mediastinum and hilar structures normal. Heart size normal. Low lung volumes. No focal infiltrate. No pleural effusion or pneumothorax. No  acute bony abnormality. Pleural effusion or pneumothorax. IMPRESSION: Low lung volumes.  No acute abnormality identified. Electronically Signed   By: Maisie Fushomas  Register   On: 12/07/2018 13:49    Procedures Procedures (including critical care time)  Medications Ordered in ED Medications  acetaminophen (TYLENOL) tablet 650 mg (650 mg Oral Given 12/07/18 1317)     Initial Impression / Assessment and Plan / ED Course  I have reviewed the triage vital signs and the nursing notes.  Pertinent labs & imaging results that were available during my care of the patient were reviewed by me and considered in my medical decision making (see chart for details).    Patient presents with flu type symptoms.  Patient is nontoxic appearing, in no apparent distress, febrile upon arrival with likely resultant tachycardia- trending down with anti-pyretics. Patient lungs are CTA, CXR negative for infiltrate, doubt pneumonia. There is no wheezing or signs of respiratory distress. Sxs onset < 7 days, no sinus tenderness, doubt acute bacterial sinusitis. Centor score 1, doubt strep pharyngitis. No evidence of AOM on exam. No meningeal signs. She states she often has asymptomatic UTIs- requested urine be checked- UA without obvious UTI, preg test negative. Suspect influenza vs. Influenza like illness, discussed risks/benefits of tamiflu- patient opted to potentially take this- given prescription. Will also provide Naproxen, Flonase, and Tessalon for symptomatic relief. I discussed results, treatment plan, need for PCP follow-up, and return precautions with the patient. Provided opportunity for questions, patient confirmed understanding and is in agreement with plan.   Vitals:   12/07/18 1419 12/07/18 1526  BP: (!) 141/77 123/64  Pulse: (!) 113 (!) 105  Resp: (!) 24 20  Temp: (!) 102.3 F (39.1 C) 99.8 F (37.7 C)  SpO2: 95% 97%    Final Clinical Impressions(s) / ED Diagnoses   Final diagnoses:  Flu-like symptoms     ED Discharge Orders         Ordered    naproxen (NAPROSYN) 500 MG tablet  2 times daily,   Status:  Discontinued     12/07/18 1551    fluticasone (FLONASE) 50 MCG/ACT nasal spray  Daily,   Status:  Discontinued     12/07/18 1551    benzonatate (TESSALON) 100 MG capsule  Every 8 hours,   Status:  Discontinued     12/07/18 1551    oseltamivir (TAMIFLU) 75 MG capsule  Every 12 hours,   Status:  Discontinued     12/07/18 1551    benzonatate (TESSALON) 100 MG capsule  Every 8 hours     12/07/18 1608    fluticasone (FLONASE) 50 MCG/ACT nasal spray  Daily     12/07/18 1608    naproxen (NAPROSYN) 500 MG tablet  2 times daily     12/07/18 1608    oseltamivir (TAMIFLU) 75 MG capsule  Every 12 hours     12/07/18 [address] Pleas Koch, PA-C 12/07/18 1615    Azalia Bilis, MD 12/08/18 [phone]

## 2018-12-07 NOTE — ED Triage Notes (Addendum)
C/o flu like sx started yesterday-NAD-steady gait-last dose motrin yesterday

## 2019-11-22 ENCOUNTER — Ambulatory Visit: Payer: Managed Care, Other (non HMO) | Attending: Internal Medicine

## 2019-11-22 DIAGNOSIS — Z20822 Contact with and (suspected) exposure to covid-19: Secondary | ICD-10-CM

## 2019-11-24 LAB — NOVEL CORONAVIRUS, NAA: SARS-CoV-2, NAA: NOT DETECTED

## 2019-12-26 ENCOUNTER — Ambulatory Visit: Payer: Managed Care, Other (non HMO) | Attending: Internal Medicine

## 2019-12-27 ENCOUNTER — Ambulatory Visit: Payer: Managed Care, Other (non HMO) | Attending: Internal Medicine

## 2020-03-24 ENCOUNTER — Ambulatory Visit: Payer: Self-pay

## 2020-03-30 ENCOUNTER — Ambulatory Visit: Payer: Self-pay | Attending: Internal Medicine

## 2020-03-30 DIAGNOSIS — Z23 Encounter for immunization: Secondary | ICD-10-CM

## 2020-03-30 NOTE — Progress Notes (Signed)
   Covid-19 Vaccination Clinic  Name:  METZLI POLLICK    MRN: [id number] DOB: October 28, 1996  03/30/2020  Ms. Podgurski was observed post Covid-19 immunization for 15 minutes without incident. She was provided with Vaccine Information Sheet and instruction to access the V-Safe system.   Ms. Castleman was instructed to call 911 with any severe reactions post vaccine: Marland Kitchen Difficulty breathing  . Swelling of face and throat  . A fast heartbeat  . A bad rash all over body  . Dizziness and weakness   Immunizations Administered    Name Date Dose VIS Date Route   Pfizer COVID-19 Vaccine 03/30/2020  4:59 PM 0.3 mL 01/25/2019 Intramuscular   Manufacturer: [employer], [employer]   Lot: [id number]   NDC: [address]-7

## 2020-04-23 ENCOUNTER — Ambulatory Visit: Payer: Self-pay

## 2020-04-25 ENCOUNTER — Ambulatory Visit: Payer: Self-pay | Attending: Internal Medicine

## 2020-04-25 DIAGNOSIS — Z23 Encounter for immunization: Secondary | ICD-10-CM

## 2020-04-25 NOTE — Progress Notes (Signed)
   Covid-19 Vaccination Clinic  Name:  Autumn Nunez    MRN: [id number] DOB: Jul 30, 1996  04/25/2020  [name] was observed post Covid-19 immunization for 15 minutes without incident. She was provided with Vaccine Information Sheet and instruction to access the V-Safe system.   [name] was instructed to call 911 with any severe reactions post vaccine: Marland Kitchen Difficulty breathing  . Swelling of face and throat  . A fast heartbeat  . A bad rash all over body  . Dizziness and weakness   Immunizations Administered    Name Date Dose VIS Date Route   Pfizer COVID-19 Vaccine 04/25/2020  4:42 PM 0.3 mL 01/25/2019 Intramuscular   Manufacturer: [employer], [employer]   Lot: [id number]   NDC: [address]-0

## 2020-05-15 ENCOUNTER — Ambulatory Visit: Payer: Self-pay | Admitting: Psychology

## 2020-12-17 IMAGING — CR DG CHEST 2V
2 series · 2 of 2 positions shown · non-contrast
Comparison: 01/22/2015.

CLINICAL DATA: Cough and congestion.

EXAM:
CHEST - 2 VIEW

[w chest pa]
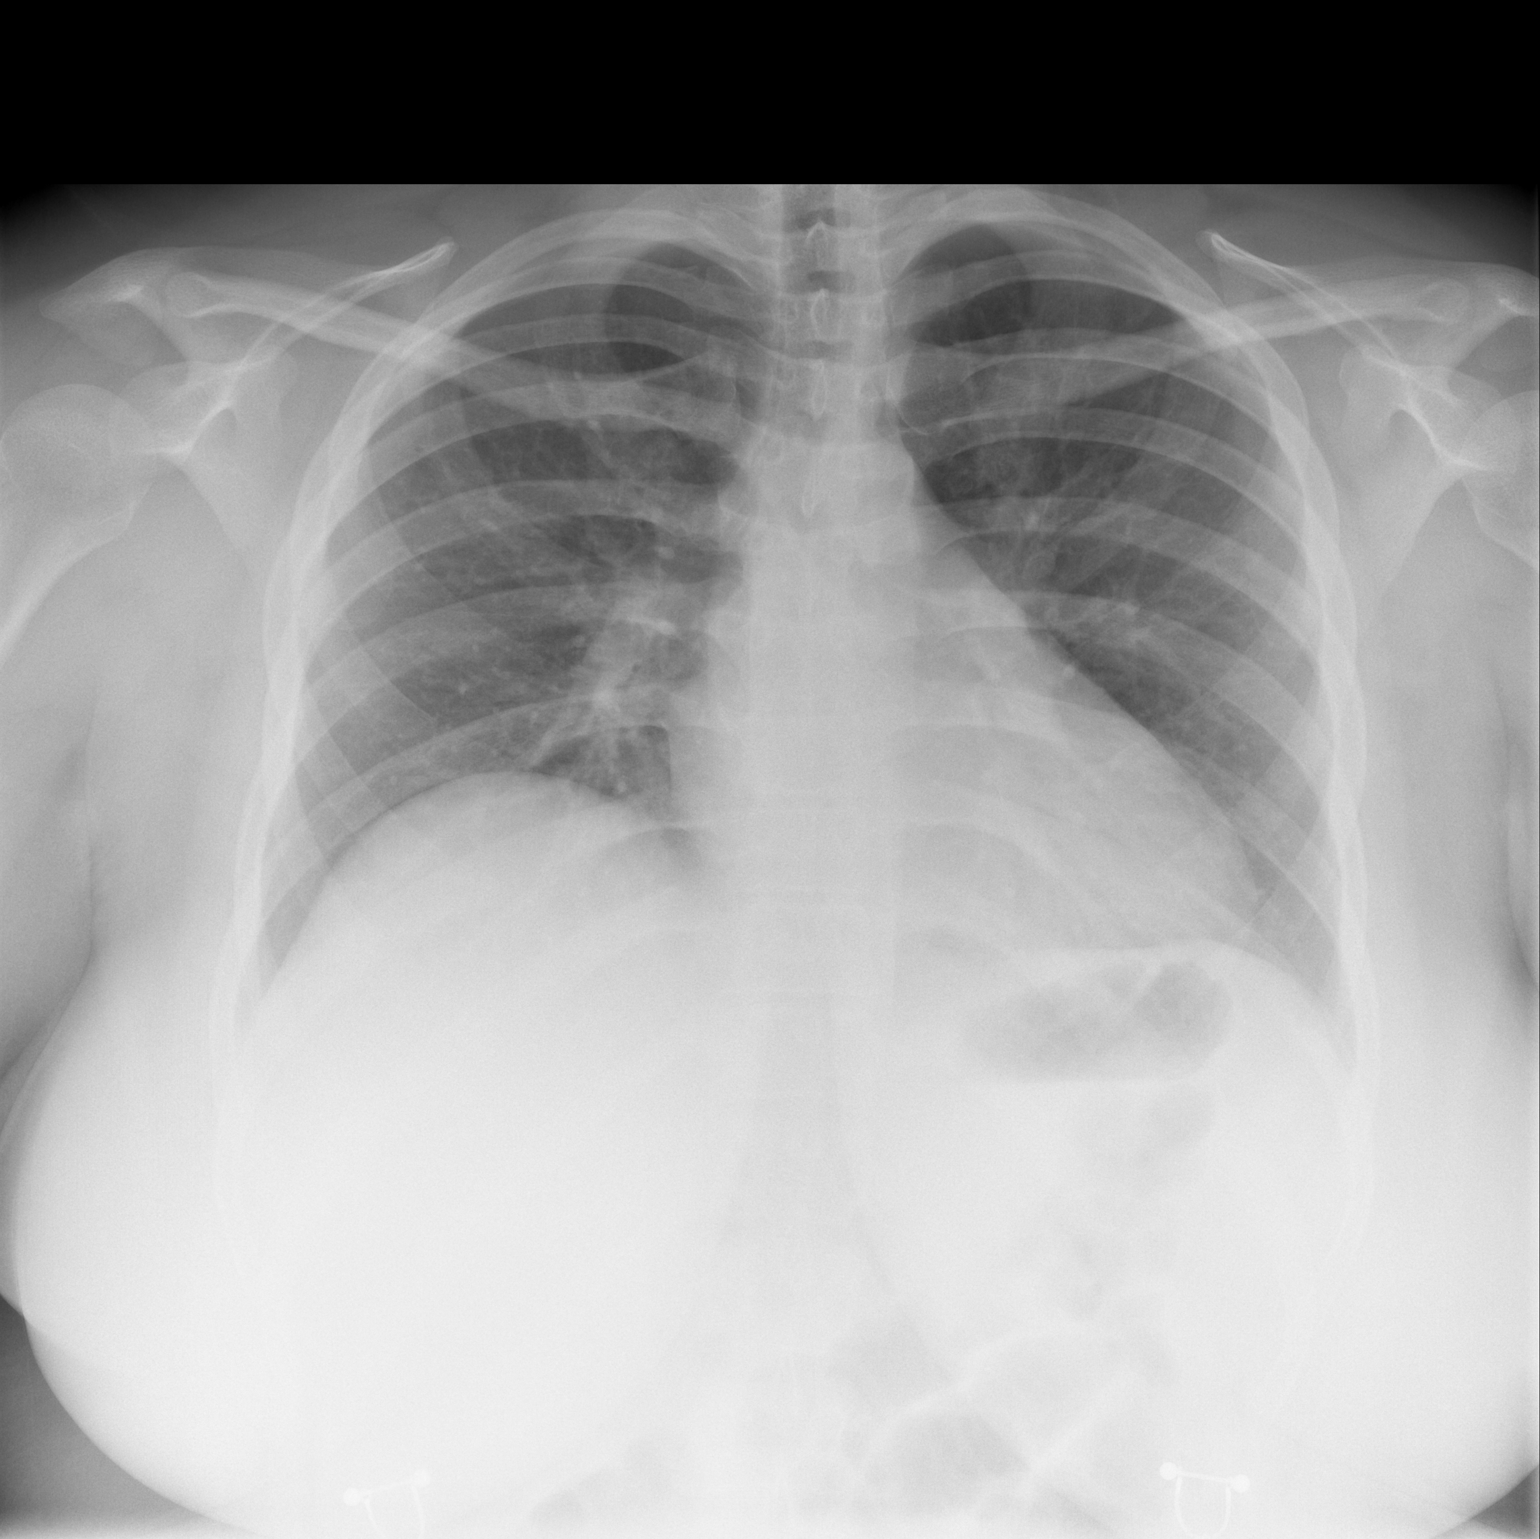

[w chest lat]
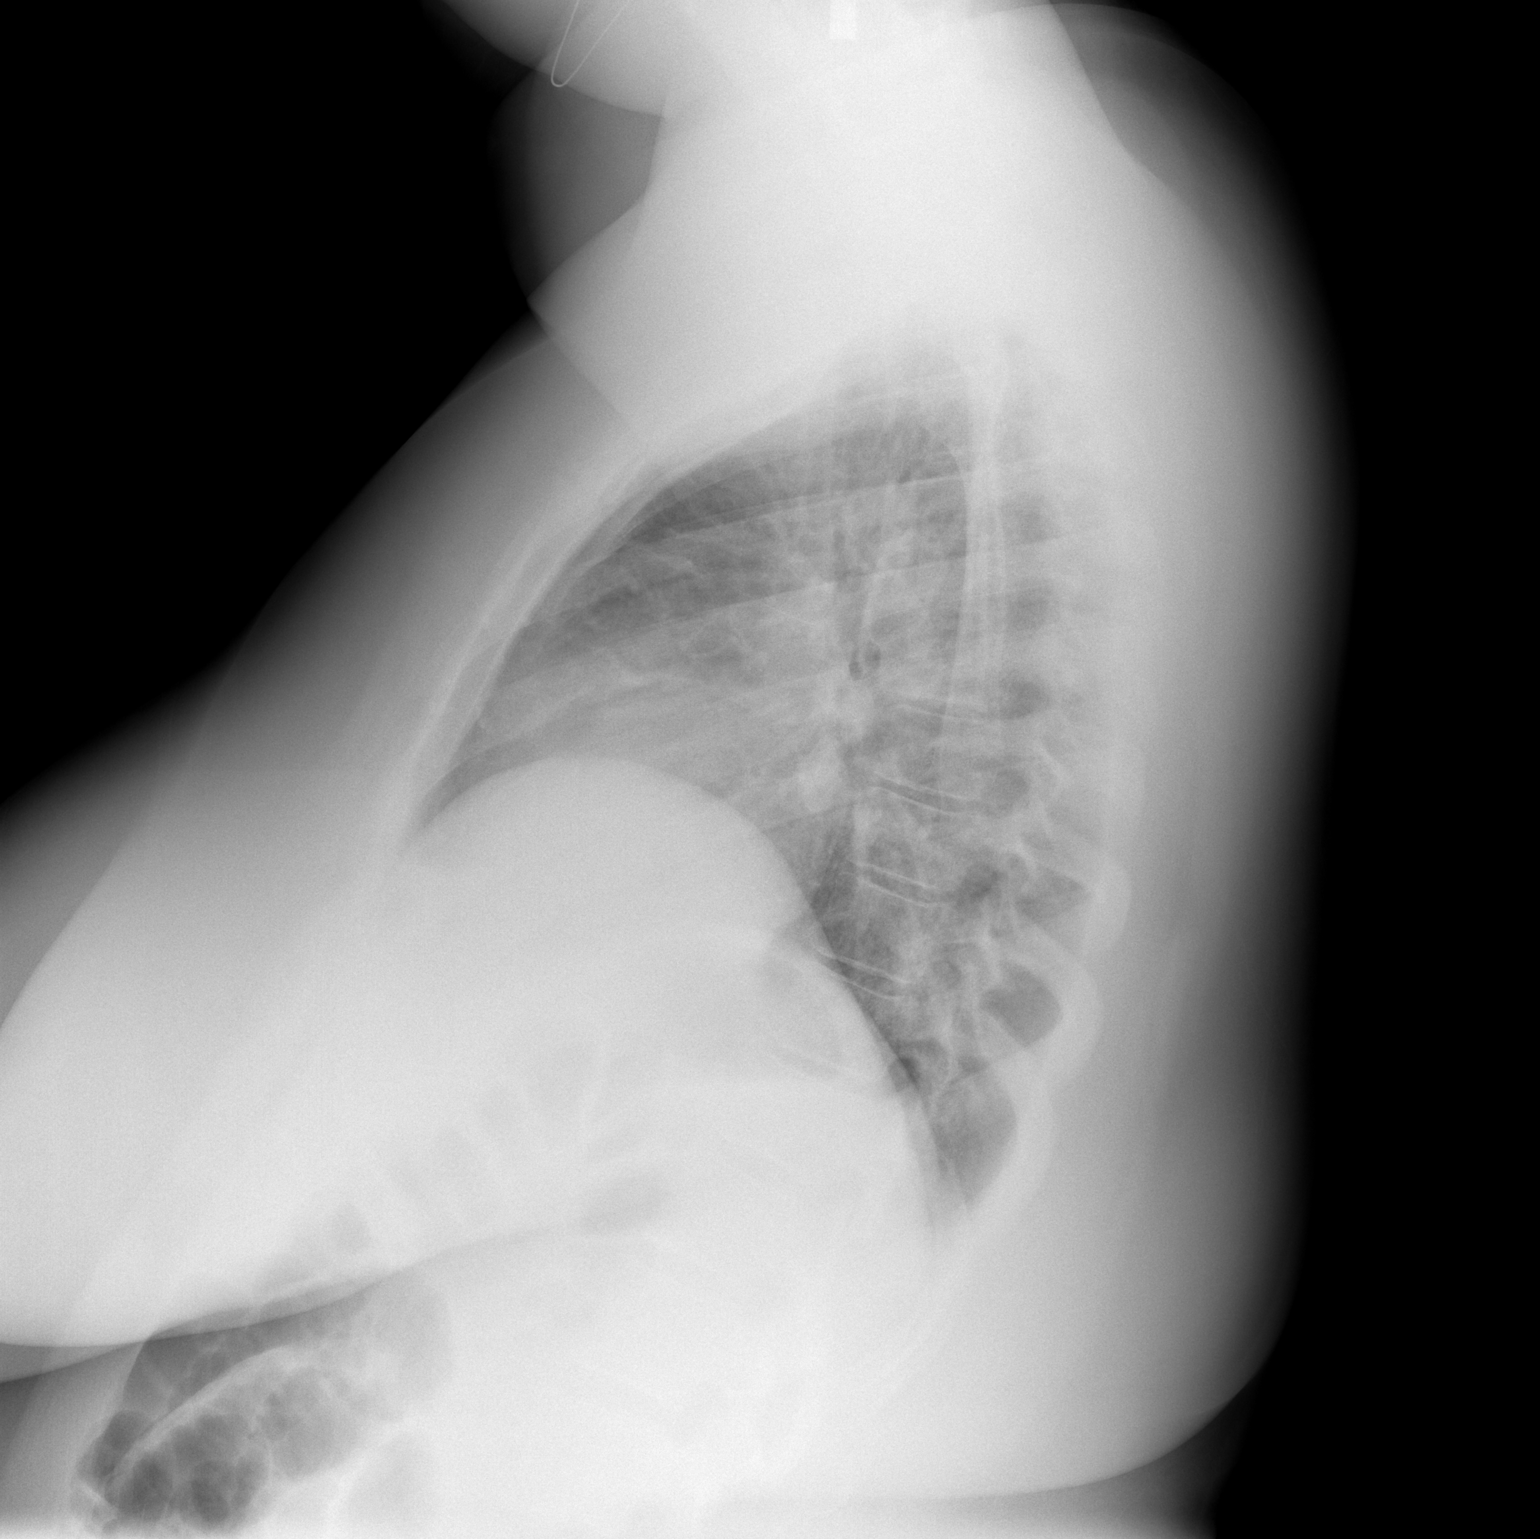

[2 of 2 positions shown; findings below may reference images not displayed]

FINDINGS: Mediastinum and hilar structures normal. Heart size normal. Low lung
volumes. No focal infiltrate. No pleural effusion or pneumothorax.
No acute bony abnormality. Pleural effusion or pneumothorax.
IMPRESSION: Low lung volumes.  No acute abnormality identified.

## 2023-02-04 LAB — OB RESULTS CONSOLE HEPATITIS B SURFACE ANTIGEN: Hepatitis B Surface Ag: NEGATIVE

## 2023-02-04 LAB — HEPATITIS C ANTIBODY: HCV Ab: NEGATIVE

## 2023-02-13 LAB — OB RESULTS CONSOLE ABO/RH: RH Type: POSITIVE

## 2023-02-13 LAB — OB RESULTS CONSOLE GC/CHLAMYDIA
Chlamydia: NEGATIVE
Neisseria Gonorrhea: NEGATIVE

## 2023-02-13 LAB — OB RESULTS CONSOLE ANTIBODY SCREEN: Antibody Screen: NEGATIVE

## 2023-02-13 LAB — OB RESULTS CONSOLE RUBELLA ANTIBODY, IGM: Rubella: NON-IMMUNE/NOT IMMUNE

## 2023-03-19 ENCOUNTER — Other Ambulatory Visit: Payer: Self-pay

## 2023-03-19 ENCOUNTER — Other Ambulatory Visit (HOSPITAL_BASED_OUTPATIENT_CLINIC_OR_DEPARTMENT_OTHER): Payer: Self-pay

## 2023-03-19 ENCOUNTER — Encounter (HOSPITAL_BASED_OUTPATIENT_CLINIC_OR_DEPARTMENT_OTHER): Payer: Self-pay | Admitting: Emergency Medicine

## 2023-03-19 ENCOUNTER — Emergency Department (HOSPITAL_BASED_OUTPATIENT_CLINIC_OR_DEPARTMENT_OTHER)
Admission: EM | Admit: 2023-03-19 | Discharge: 2023-03-19 | Disposition: A | Discharge status: Home / Self Care | Payer: Medicaid Other | Attending: Emergency Medicine | Admitting: Emergency Medicine

## 2023-03-19 ENCOUNTER — Other Ambulatory Visit: Payer: Self-pay | Admitting: Obstetrics and Gynecology

## 2023-03-19 DIAGNOSIS — R197 Diarrhea, unspecified: Secondary | ICD-10-CM | POA: Insufficient documentation

## 2023-03-19 DIAGNOSIS — O219 Vomiting of pregnancy, unspecified: Secondary | ICD-10-CM | POA: Diagnosis not present

## 2023-03-19 DIAGNOSIS — O99891 Other specified diseases and conditions complicating pregnancy: Secondary | ICD-10-CM | POA: Insufficient documentation

## 2023-03-19 DIAGNOSIS — R112 Nausea with vomiting, unspecified: Secondary | ICD-10-CM

## 2023-03-19 DIAGNOSIS — Z363 Encounter for antenatal screening for malformations: Secondary | ICD-10-CM

## 2023-03-19 DIAGNOSIS — Z3A15 15 weeks gestation of pregnancy: Secondary | ICD-10-CM | POA: Insufficient documentation

## 2023-03-19 LAB — CBC WITH DIFFERENTIAL/PLATELET
Abs Immature Granulocytes: 0.02 10*3/uL (ref 0.00–0.07)
Basophils Absolute: 0 10*3/uL (ref 0.0–0.1)
Basophils Relative: 0 %
Eosinophils Absolute: 0 10*3/uL (ref 0.0–0.5)
Eosinophils Relative: 0 %
HCT: 38.6 % (ref 36.0–46.0)
Hemoglobin: 12.3 g/dL (ref 12.0–15.0)
Immature Granulocytes: 0 %
Lymphocytes Relative: 7 %
Lymphs Abs: 0.4 10*3/uL — ABNORMAL LOW (ref 0.7–4.0)
MCH: 27 pg (ref 26.0–34.0)
MCHC: 31.9 g/dL (ref 30.0–36.0)
MCV: 84.6 fL (ref 80.0–100.0)
Monocytes Absolute: 0.3 10*3/uL (ref 0.1–1.0)
Monocytes Relative: 5 %
Neutro Abs: 5 10*3/uL (ref 1.7–7.7)
Neutrophils Relative %: 88 %
Platelets: 307 10*3/uL (ref 150–400)
RBC: 4.56 MIL/uL (ref 3.87–5.11)
RDW: 15.2 % (ref 11.5–15.5)
WBC: 5.7 10*3/uL (ref 4.0–10.5)
nRBC: 0 % (ref 0.0–0.2)

## 2023-03-19 LAB — COMPREHENSIVE METABOLIC PANEL
ALT: 11 U/L (ref 0–44)
AST: 15 U/L (ref 15–41)
Albumin: 3.1 g/dL — ABNORMAL LOW (ref 3.5–5.0)
Alkaline Phosphatase: 55 U/L (ref 38–126)
Anion gap: 7 (ref 5–15)
BUN: 11 mg/dL (ref 6–20)
CO2: 22 mmol/L (ref 22–32)
Calcium: 8.5 mg/dL — ABNORMAL LOW (ref 8.9–10.3)
Chloride: 106 mmol/L (ref 98–111)
Creatinine, Ser: 0.56 mg/dL (ref 0.44–1.00)
GFR, Estimated: >60 mL/min (ref >60)
Glucose, Bld: 107 mg/dL — ABNORMAL HIGH (ref 70–99)
Potassium: 3.7 mmol/L (ref 3.5–5.1)
Sodium: 135 mmol/L (ref 135–145)
Total Bilirubin: 1.2 mg/dL (ref 0.3–1.2)
Total Protein: 7 g/dL (ref 6.5–8.1)

## 2023-03-19 LAB — URINALYSIS, ROUTINE W REFLEX MICROSCOPIC
Glucose, UA: NEGATIVE mg/dL
Ketones, ur: NEGATIVE mg/dL
Leukocytes,Ua: NEGATIVE
Nitrite: NEGATIVE
Protein, ur: 30 mg/dL — AB
Specific Gravity, Urine: >=1.03 (ref 1.005–1.030)
pH: 5.5 (ref 5.0–8.0)

## 2023-03-19 LAB — URINALYSIS, MICROSCOPIC (REFLEX)

## 2023-03-19 LAB — LIPASE, BLOOD: Lipase: 25 U/L (ref 11–51)

## 2023-03-19 MED ORDER — PROCHLORPERAZINE EDISYLATE 10 MG/2ML IJ SOLN: 10.0000 mg | Freq: Once | INTRAMUSCULAR | Status: DC

## 2023-03-19 MED ORDER — ONDANSETRON HCL 4 MG/2ML IJ SOLN
4.0000 mg | Freq: Once | INTRAMUSCULAR | Status: AC
  Administered 2023-03-19: 4 mg via INTRAVENOUS
  Filled 2023-03-19: qty 2

## 2023-03-19 MED ORDER — SODIUM CHLORIDE 0.9 % IV BOLUS
1000.0000 mL | Freq: Once | INTRAVENOUS | Status: AC
  Administered 2023-03-19: 1000 mL via INTRAVENOUS

## 2023-03-19 MED ORDER — ONDANSETRON 4 MG PO TBDP
4.0000 mg | ORAL_TABLET | Freq: Three times a day (TID) | ORAL | 0 refills | Status: DC | PRN
  Filled 2023-03-19: qty 12, 4d supply, fill #0

## 2023-03-19 NOTE — Discharge Instructions (Addendum)
You may either be experiencing a viral stomach bug, which can cause nausea vomiting and diarrhea, or possibly illness from something you ate recently.  Both of these conditions are usually self resolving, with nausea and upset stomach typically lasting 3 to 5 days.  Continue to drink water or Gatorade at home to keep yourself hydrated.  I prescribed you Zofran which can use for nausea at home as needed.  You can follow-up with your primary care doctor's office or your OB/GYN provider next week for recheck of your symptoms.

## 2023-03-19 NOTE — ED Triage Notes (Signed)
Patient presents to ED via POV from home. Here with nausea, vomiting and diarrhea that began last night. Patient is [redacted] weeks pregnant. This is her first pregnancy. Reports lower abdominal cramping and back pain. Denies vaginal bleeding.

## 2023-03-19 NOTE — ED Provider Notes (Signed)
[place] EMERGENCY DEPARTMENT AT [facility] Provider Note   CSN: [id number] Arrival date & time: 03/19/23  4098     History  Chief Complaint  Patient presents with   Emesis During Pregnancy    Autumn Nunez is a 27 y.o. female who reports she is approximately [redacted] weeks gestation, presents ED with nausea, vomiting, and diarrhea.  She reports abrupt onset of her symptoms around 7 PM last night.  She reports she had [employer] cheeseburger for dinner around 5 PM.  She denies any sick contacts in the house.  She says she has had persistent vomiting overnight as well as loose nonbloody bowel movements.  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  She reports this is her first pregnancy.  She follows with [facility].    She denies vaginal bleeding or discharge.  HPI     Home Medications Prior to Admission medications   Medication Sig Start Date End Date Taking? Authorizing Provider  ondansetron (ZOFRAN-ODT) 4 MG disintegrating tablet Take 1 tablet (4 mg total) by mouth every 8 (eight) hours as needed for up to 12 doses for nausea or vomiting. 03/19/23  Yes Chauna Osoria, Kermit Balo, MD  albuterol (PROVENTIL HFA;VENTOLIN HFA) 108 (90 BASE) MCG/ACT inhaler Inhale 2 puffs into the lungs every 4 (four) hours as needed for wheezing or shortness of breath (cough, shortness of breath or wheezing.). 01/22/15   McVeigh, Tawanna Cooler, PA  benzonatate (TESSALON) 100 MG capsule Take 1 capsule (100 mg total) by mouth every 8 (eight) hours. 12/07/18   Petrucelli, Samantha R, PA-C  doxycycline (VIBRAMYCIN) 100 MG capsule Take 1 capsule (100 mg total) by mouth 2 (two) times daily. One po bid x 7 days 10/01/18   Dartha Lodge, PA-C  etonogestrel (IMPLANON) 68 MG IMPL implant Inject 1 each into the skin once.    [provider]  fluticasone (FLONASE) 50 MCG/ACT nasal spray Place 1 spray into both nostrils daily. 12/07/18   Petrucelli, Samantha R, PA-C   naproxen (NAPROSYN) 500 MG tablet Take 1 tablet (500 mg total) by mouth 2 (two) times daily. 12/07/18   Petrucelli, Samantha R, PA-C  ondansetron (ZOFRAN) 4 MG tablet Take 1 tablet (4 mg total) by mouth every 8 (eight) hours as needed for nausea or vomiting. 10/04/18   Curatolo, Adam, DO  oseltamivir (TAMIFLU) 75 MG capsule Take 1 capsule (75 mg total) by mouth every 12 (twelve) hours. 12/07/18   Petrucelli, Pleas Koch, PA-C      Allergies    Patient has no known allergies.    Review of Systems   Review of Systems  Physical Exam Updated Vital Signs BP 122/76   Pulse 89   Temp 98 F (36.7 C) (Oral)   Resp 16   Ht 5\' 4"  (1.626 m)   Wt 81.6 kg   SpO2 98%   BMI 30.90 kg/m  Physical Exam Constitutional:      General: She is not in acute distress. HENT:     Head: Normocephalic and atraumatic.  Eyes:     Conjunctiva/sclera: Conjunctivae normal.     Pupils: Pupils are equal, round, and reactive to light.  Cardiovascular:     Rate and Rhythm: Normal rate and regular rhythm.  Pulmonary:     Effort: Pulmonary effort is normal. No respiratory distress.  Abdominal:     General: There is no distension.     Tenderness: There is no abdominal tenderness.  Skin:  General: Skin is warm and dry.  Neurological:     General: No focal deficit present.     Mental Status: She is alert. Mental status is at baseline.  Psychiatric:        Mood and Affect: Mood normal.        Behavior: Behavior normal.     ED Results / Procedures / Treatments   Labs (all labs ordered are listed, but only abnormal results are displayed) Labs Reviewed  COMPREHENSIVE METABOLIC PANEL - Abnormal; Notable for the following components:      Result Value   Glucose, Bld 107 (*)    Calcium 8.5 (*)    Albumin 3.1 (*)    All other components within normal limits  CBC WITH DIFFERENTIAL/PLATELET - Abnormal; Notable for the following components:   Lymphs Abs 0.4 (*)    All other components within normal limits   URINALYSIS, ROUTINE W REFLEX MICROSCOPIC - Abnormal; Notable for the following components:   APPearance HAZY (*)    Hgb urine dipstick TRACE (*)    Bilirubin Urine SMALL (*)    Protein, ur 30 (*)    All other components within normal limits  URINALYSIS, MICROSCOPIC (REFLEX) - Abnormal; Notable for the following components:   Bacteria, UA MANY (*)    All other components within normal limits  LIPASE, BLOOD    EKG None  Radiology No results found.  Procedures Procedures    Medications Ordered in ED Medications  prochlorperazine (COMPAZINE) injection 10 mg (10 mg Intravenous Not Given 03/19/23 0834)  sodium chloride 0.9 % bolus 1,000 mL (1,000 mLs Intravenous New Bag/Given 03/19/23 0839)  ondansetron (ZOFRAN) injection 4 mg (4 mg Intravenous Given 03/19/23 7846)    ED Course/ Medical Decision Making/ A&P Clinical Course as of 03/19/23 1129  Thu Mar 19, 2023  1045 Patient reassessed and is doing much better at this point.  She was able to tolerate Gatorade and fluids and feels better.  Will continue to monitor her for period of time and anticipate discharge afterwards [MT]  1128 Patient reassessed and again has been comfortable and has completed a full cup of water, sleeping comfortably, okay for discharge [MT]    Clinical Course User Index [MT] Ahtziri Jeffries, Kermit Balo, MD                             Medical Decision Making Amount and/or Complexity of Data Reviewed Labs: ordered.  Risk Prescription drug management.   This patient presents to the ED with concern for nausea, vomiting, diarrhea. This involves an extensive number of treatment options, and is a complaint that carries with it a high risk of complications and morbidity.  The differential diagnosis includes viral gastroenteritis versus foodborne illness versus other  I ordered and personally interpreted labs.  The pertinent results include: No emergent findings  The patient was maintained on a cardiac monitor.  I  personally viewed and interpreted the cardiac monitored which showed an underlying rhythm of: Normal sinus rhythm  I ordered medication including IV fluids and IV with Zofran and Compazine.  I did discuss with the patient the risks and benefits of these antiemetic medications in pregnancy.  At this point, I do believe that the clinical risk of dehydration presents to more serious concern to her pregnancy than potential adverse effects of limited dose antiemetics.  Patient and her mother are in agreement with proceeding with these medications.  I have reviewed the patients  home medicines and have made adjustments as needed  Test Considered: At this time there is no indication for emergent CT imaging of the abdomen, low suspicion for acute pancreatitis, acute biliary disease, acute appendicitis, or other emergently surgical pelvic or intra-abdominal pathology  After the interventions noted above, I reevaluated the patient and found that they have: improved   Dispostion:  After consideration of the diagnostic results and the patients response to treatment, I feel that the patent would benefit from outpatient OB or PCP follow-up.         Final Clinical Impression(s) / ED Diagnoses Final diagnoses:  Nausea vomiting and diarrhea    Rx / DC Orders ED Discharge Orders          Ordered    ondansetron (ZOFRAN-ODT) 4 MG disintegrating tablet  Every 8 hours PRN        03/19/23 1128              Terald Sleeper, MD 03/19/23 1129

## 2023-04-22 ENCOUNTER — Encounter: Payer: Self-pay | Admitting: *Deleted

## 2023-04-22 DIAGNOSIS — Z148 Genetic carrier of other disease: Secondary | ICD-10-CM | POA: Insufficient documentation

## 2023-04-24 ENCOUNTER — Other Ambulatory Visit: Payer: Self-pay | Admitting: *Deleted

## 2023-04-24 ENCOUNTER — Encounter: Payer: Self-pay | Admitting: *Deleted

## 2023-04-24 ENCOUNTER — Ambulatory Visit: Payer: Medicaid Other | Admitting: *Deleted

## 2023-04-24 ENCOUNTER — Ambulatory Visit: Payer: Medicaid Other | Attending: Obstetrics and Gynecology

## 2023-04-24 VITALS — BP 117/54 | HR 79

## 2023-04-24 DIAGNOSIS — Z363 Encounter for antenatal screening for malformations: Secondary | ICD-10-CM | POA: Insufficient documentation

## 2023-04-24 DIAGNOSIS — O99212 Obesity complicating pregnancy, second trimester: Secondary | ICD-10-CM

## 2023-04-24 DIAGNOSIS — Z148 Genetic carrier of other disease: Secondary | ICD-10-CM | POA: Diagnosis present

## 2023-06-05 ENCOUNTER — Ambulatory Visit: Payer: Medicaid Other | Attending: Obstetrics and Gynecology

## 2023-06-05 ENCOUNTER — Encounter: Payer: Self-pay | Admitting: Maternal & Fetal Medicine

## 2023-06-05 ENCOUNTER — Ambulatory Visit: Payer: Medicaid Other | Admitting: *Deleted

## 2023-06-05 ENCOUNTER — Other Ambulatory Visit: Payer: Self-pay | Admitting: *Deleted

## 2023-06-05 VITALS — BP 131/63 | HR 91

## 2023-06-05 DIAGNOSIS — E669 Obesity, unspecified: Secondary | ICD-10-CM

## 2023-06-05 DIAGNOSIS — O99212 Obesity complicating pregnancy, second trimester: Secondary | ICD-10-CM | POA: Insufficient documentation

## 2023-06-05 DIAGNOSIS — O35BXX0 Maternal care for other (suspected) fetal abnormality and damage, fetal cardiac anomalies, not applicable or unspecified: Secondary | ICD-10-CM | POA: Diagnosis not present

## 2023-06-05 DIAGNOSIS — Z148 Genetic carrier of other disease: Secondary | ICD-10-CM | POA: Insufficient documentation

## 2023-06-05 DIAGNOSIS — O99842 Bariatric surgery status complicating pregnancy, second trimester: Secondary | ICD-10-CM | POA: Diagnosis not present

## 2023-06-05 DIAGNOSIS — O285 Abnormal chromosomal and genetic finding on antenatal screening of mother: Secondary | ICD-10-CM

## 2023-06-05 DIAGNOSIS — Z3A26 26 weeks gestation of pregnancy: Secondary | ICD-10-CM

## 2023-06-12 LAB — OB RESULTS CONSOLE HIV ANTIBODY (ROUTINE TESTING): HIV: NONREACTIVE

## 2023-06-12 LAB — OB RESULTS CONSOLE RPR
RPR: NONREACTIVE
RPR: NONREACTIVE

## 2023-07-07 ENCOUNTER — Other Ambulatory Visit: Payer: Self-pay | Admitting: Obstetrics and Gynecology

## 2023-07-10 ENCOUNTER — Other Ambulatory Visit: Payer: Self-pay | Admitting: *Deleted

## 2023-07-10 ENCOUNTER — Ambulatory Visit: Payer: Medicaid Other | Attending: Maternal & Fetal Medicine

## 2023-07-10 DIAGNOSIS — Z148 Genetic carrier of other disease: Secondary | ICD-10-CM

## 2023-07-10 DIAGNOSIS — O99213 Obesity complicating pregnancy, third trimester: Secondary | ICD-10-CM

## 2023-07-10 DIAGNOSIS — Z3A31 31 weeks gestation of pregnancy: Secondary | ICD-10-CM

## 2023-07-10 DIAGNOSIS — Z9884 Bariatric surgery status: Secondary | ICD-10-CM

## 2023-07-10 DIAGNOSIS — Z362 Encounter for other antenatal screening follow-up: Secondary | ICD-10-CM

## 2023-07-10 DIAGNOSIS — O35BXX0 Maternal care for other (suspected) fetal abnormality and damage, fetal cardiac anomalies, not applicable or unspecified: Secondary | ICD-10-CM | POA: Diagnosis not present

## 2023-07-10 DIAGNOSIS — O99212 Obesity complicating pregnancy, second trimester: Secondary | ICD-10-CM | POA: Insufficient documentation

## 2023-07-10 DIAGNOSIS — E669 Obesity, unspecified: Secondary | ICD-10-CM

## 2023-07-10 DIAGNOSIS — O99843 Bariatric surgery status complicating pregnancy, third trimester: Secondary | ICD-10-CM | POA: Diagnosis not present

## 2023-07-10 DIAGNOSIS — O285 Abnormal chromosomal and genetic finding on antenatal screening of mother: Secondary | ICD-10-CM

## 2023-07-31 DIAGNOSIS — Z9884 Bariatric surgery status: Secondary | ICD-10-CM | POA: Insufficient documentation

## 2023-08-07 ENCOUNTER — Ambulatory Visit: Payer: Medicaid Other | Attending: Obstetrics and Gynecology

## 2023-08-07 DIAGNOSIS — O99842 Bariatric surgery status complicating pregnancy, second trimester: Secondary | ICD-10-CM

## 2023-08-07 DIAGNOSIS — O35BXX0 Maternal care for other (suspected) fetal abnormality and damage, fetal cardiac anomalies, not applicable or unspecified: Secondary | ICD-10-CM | POA: Diagnosis not present

## 2023-08-07 DIAGNOSIS — O99212 Obesity complicating pregnancy, second trimester: Secondary | ICD-10-CM | POA: Diagnosis not present

## 2023-08-07 DIAGNOSIS — Z362 Encounter for other antenatal screening follow-up: Secondary | ICD-10-CM | POA: Insufficient documentation

## 2023-08-07 DIAGNOSIS — E669 Obesity, unspecified: Secondary | ICD-10-CM | POA: Diagnosis not present

## 2023-08-07 DIAGNOSIS — O285 Abnormal chromosomal and genetic finding on antenatal screening of mother: Secondary | ICD-10-CM

## 2023-08-07 DIAGNOSIS — Z9884 Bariatric surgery status: Secondary | ICD-10-CM | POA: Diagnosis present

## 2023-08-07 DIAGNOSIS — Z148 Genetic carrier of other disease: Secondary | ICD-10-CM

## 2023-08-07 DIAGNOSIS — Z3A35 35 weeks gestation of pregnancy: Secondary | ICD-10-CM

## 2023-08-17 NOTE — Patient Instructions (Signed)
OCTAVA PRANKE  08/17/2023   Your procedure is scheduled on:  08/30/2023  Arrive at 0930 at Entrance C on [address] at [facility]  and [employer]. You are invited to use the FREE valet parking or use the Visitor's parking deck.  Pick up the phone at the desk and dial [phone].  Call this number if you have problems the morning of surgery: [phone]  Remember:   Do not eat food:(After Midnight) Desps de medianoche.  Do not drink clear liquids: (After Midnight) Desps de medianoche.  Take these medicines the morning of surgery with A SIP OF WATER:  none   Do not wear jewelry, make-up or nail polish.  Do not wear lotions, powders, or perfumes. Do not wear deodorant.  Do not shave 48 hours prior to surgery.  Do not bring valuables to the hospital.  [facility] is not   responsible for any belongings or valuables brought to the hospital.  Contacts, dentures or bridgework may not be worn into surgery.  Leave suitcase in the car. After surgery it may be brought to your room.  For patients admitted to the hospital, checkout time is 11:00 AM the day of              discharge.      Please read over the following fact sheets that you were given:     Preparing for Surgery

## 2023-08-19 ENCOUNTER — Encounter (HOSPITAL_COMMUNITY): Payer: Self-pay

## 2023-08-28 ENCOUNTER — Encounter (HOSPITAL_COMMUNITY)
Admission: RE | Admit: 2023-08-28 | Discharge: 2023-08-28 | Disposition: A | Discharge status: Home / Self Care | Payer: Medicaid Other | Source: Ambulatory Visit | Attending: Obstetrics and Gynecology | Admitting: Obstetrics and Gynecology

## 2023-08-28 DIAGNOSIS — Z01812 Encounter for preprocedural laboratory examination: Secondary | ICD-10-CM | POA: Insufficient documentation

## 2023-08-28 DIAGNOSIS — Z01818 Encounter for other preprocedural examination: Secondary | ICD-10-CM | POA: Diagnosis present

## 2023-08-28 LAB — CBC
HCT: 30.4 % — ABNORMAL LOW (ref 36.0–46.0)
Hemoglobin: 8.9 g/dL — ABNORMAL LOW (ref 12.0–15.0)
MCH: 25.1 pg — ABNORMAL LOW (ref 26.0–34.0)
MCHC: 29.3 g/dL — ABNORMAL LOW (ref 30.0–36.0)
MCV: 85.6 fL (ref 80.0–100.0)
Platelets: 286 10*3/uL (ref 150–400)
RBC: 3.55 MIL/uL — ABNORMAL LOW (ref 3.87–5.11)
RDW: 13.3 % (ref 11.5–15.5)
WBC: 5.9 10*3/uL (ref 4.0–10.5)
nRBC: 0 % (ref 0.0–0.2)

## 2023-08-28 LAB — TYPE AND SCREEN
ABO/RH(D): A POS
Antibody Screen: NEGATIVE

## 2023-08-29 ENCOUNTER — Other Ambulatory Visit: Payer: Self-pay | Admitting: Obstetrics and Gynecology

## 2023-08-29 LAB — RPR: RPR Ser Ql: NONREACTIVE

## 2023-08-30 ENCOUNTER — Inpatient Hospital Stay (HOSPITAL_COMMUNITY)
Admission: RE | Admit: 2023-08-30 | Discharge: 2023-09-02 | DRG: 788 | Disposition: A | Discharge status: Home / Self Care | Payer: Medicaid Other | Attending: Obstetrics and Gynecology | Admitting: Obstetrics and Gynecology

## 2023-08-30 ENCOUNTER — Encounter (HOSPITAL_COMMUNITY): Payer: Self-pay | Admitting: Obstetrics and Gynecology

## 2023-08-30 ENCOUNTER — Encounter (HOSPITAL_COMMUNITY)
Admission: RE | Disposition: A | Discharge status: Home / Self Care | Payer: Self-pay | Source: Home / Self Care | Attending: Obstetrics and Gynecology

## 2023-08-30 ENCOUNTER — Inpatient Hospital Stay (HOSPITAL_COMMUNITY): Payer: Medicaid Other | Admitting: Certified Registered"

## 2023-08-30 ENCOUNTER — Other Ambulatory Visit: Payer: Self-pay

## 2023-08-30 DIAGNOSIS — Z3A39 39 weeks gestation of pregnancy: Secondary | ICD-10-CM

## 2023-08-30 DIAGNOSIS — Z148 Genetic carrier of other disease: Secondary | ICD-10-CM

## 2023-08-30 DIAGNOSIS — Z3490 Encounter for supervision of normal pregnancy, unspecified, unspecified trimester: Principal | ICD-10-CM

## 2023-08-30 DIAGNOSIS — O9832 Other infections with a predominantly sexual mode of transmission complicating childbirth: Secondary | ICD-10-CM | POA: Diagnosis present

## 2023-08-30 DIAGNOSIS — O2643 Herpes gestationis, third trimester: Secondary | ICD-10-CM

## 2023-08-30 DIAGNOSIS — A6000 Herpesviral infection of urogenital system, unspecified: Secondary | ICD-10-CM | POA: Diagnosis present

## 2023-08-30 DIAGNOSIS — O99214 Obesity complicating childbirth: Secondary | ICD-10-CM | POA: Diagnosis present

## 2023-08-30 DIAGNOSIS — O26893 Other specified pregnancy related conditions, third trimester: Secondary | ICD-10-CM | POA: Diagnosis present

## 2023-08-30 DIAGNOSIS — O99844 Bariatric surgery status complicating childbirth: Secondary | ICD-10-CM | POA: Diagnosis present

## 2023-08-30 DIAGNOSIS — O9902 Anemia complicating childbirth: Secondary | ICD-10-CM | POA: Diagnosis present

## 2023-08-30 DIAGNOSIS — Z98891 History of uterine scar from previous surgery: Secondary | ICD-10-CM

## 2023-08-30 LAB — CBC
HCT: 30.7 % — ABNORMAL LOW (ref 36.0–46.0)
Hemoglobin: 9.1 g/dL — ABNORMAL LOW (ref 12.0–15.0)
MCH: 25.3 pg — ABNORMAL LOW (ref 26.0–34.0)
MCHC: 29.6 g/dL — ABNORMAL LOW (ref 30.0–36.0)
MCV: 85.5 fL (ref 80.0–100.0)
Platelets: 291 10*3/uL (ref 150–400)
RBC: 3.59 MIL/uL — ABNORMAL LOW (ref 3.87–5.11)
RDW: 13.2 % (ref 11.5–15.5)
WBC: 9.4 10*3/uL (ref 4.0–10.5)
nRBC: 0 % (ref 0.0–0.2)

## 2023-08-30 LAB — CREATININE, SERUM
Creatinine, Ser: 0.6 mg/dL (ref 0.44–1.00)
GFR, Estimated: >60 mL/min (ref >60)

## 2023-08-30 LAB — ABO/RH: ABO/RH(D): A POS

## 2023-08-30 SURGERY — Surgical Case
Anesthesia: Spinal

## 2023-08-30 MED ORDER — POVIDONE-IODINE 10 % EX SWAB
2.0000 | Freq: Once | CUTANEOUS | Status: AC
  Administered 2023-08-30: 2 via TOPICAL

## 2023-08-30 MED ORDER — NALOXONE HCL 4 MG/10ML IJ SOLN: 1.0000 ug/kg/h | INTRAVENOUS | Status: DC | PRN

## 2023-08-30 MED ORDER — ACETAMINOPHEN 325 MG PO TABS
650.0000 mg | ORAL_TABLET | Freq: Four times a day (QID) | ORAL | Status: DC | PRN
  Administered 2023-08-31 – 2023-09-02 (×9): 650 mg via ORAL
  Filled 2023-08-30 (×9): qty 2

## 2023-08-30 MED ORDER — MENTHOL 3 MG MT LOZG: 1.0000 | LOZENGE | OROMUCOSAL | Status: DC | PRN

## 2023-08-30 MED ORDER — SIMETHICONE 80 MG PO CHEW
80.0000 mg | CHEWABLE_TABLET | Freq: Three times a day (TID) | ORAL | Status: DC
  Administered 2023-08-31 – 2023-09-02 (×7): 80 mg via ORAL
  Filled 2023-08-30 (×7): qty 1

## 2023-08-30 MED ORDER — OXYCODONE HCL 5 MG PO TABS
5.0000 mg | ORAL_TABLET | ORAL | Status: DC | PRN
  Administered 2023-09-01: 5 mg via ORAL
  Filled 2023-08-30: qty 1

## 2023-08-30 MED ORDER — SCOPOLAMINE 1 MG/3DAYS TD PT72: 1.0000 | MEDICATED_PATCH | Freq: Once | TRANSDERMAL | Status: DC

## 2023-08-30 MED ORDER — TRANEXAMIC ACID-NACL 1000-0.7 MG/100ML-% IV SOLN
1000.0000 mg | INTRAVENOUS | Status: AC
  Administered 2023-08-30: 1000 mg via INTRAVENOUS

## 2023-08-30 MED ORDER — DIPHENHYDRAMINE HCL 50 MG/ML IJ SOLN: 12.5000 mg | INTRAMUSCULAR | Status: DC | PRN

## 2023-08-30 MED ORDER — MORPHINE SULFATE (PF) 0.5 MG/ML IJ SOLN
INTRAMUSCULAR | Status: AC
  Filled 2023-08-30: qty 10

## 2023-08-30 MED ORDER — PRENATAL MULTIVITAMIN CH
1.0000 | ORAL_TABLET | Freq: Every day | ORAL | Status: DC
  Administered 2023-08-31 – 2023-09-02 (×3): 1 via ORAL
  Filled 2023-08-30 (×3): qty 1

## 2023-08-30 MED ORDER — SENNOSIDES-DOCUSATE SODIUM 8.6-50 MG PO TABS
2.0000 | ORAL_TABLET | ORAL | Status: DC
  Administered 2023-08-31 – 2023-09-01 (×2): 2 via ORAL
  Filled 2023-08-30 (×2): qty 2

## 2023-08-30 MED ORDER — CEFAZOLIN SODIUM-DEXTROSE 2-4 GM/100ML-% IV SOLN
2.0000 g | INTRAVENOUS | Status: AC
  Administered 2023-08-30: 2 g via INTRAVENOUS

## 2023-08-30 MED ORDER — COCONUT OIL OIL: 1.0000 | TOPICAL_OIL | Status: DC | PRN

## 2023-08-30 MED ORDER — KETOROLAC TROMETHAMINE 30 MG/ML IJ SOLN: 30.0000 mg | Freq: Four times a day (QID) | INTRAMUSCULAR | Status: AC | PRN

## 2023-08-30 MED ORDER — ACETAMINOPHEN 10 MG/ML IV SOLN
INTRAVENOUS | Status: DC | PRN
Start: 2023-08-30 — End: 2023-08-30
  Administered 2023-08-30: 1000 mg via INTRAVENOUS

## 2023-08-30 MED ORDER — DEXAMETHASONE SODIUM PHOSPHATE 10 MG/ML IJ SOLN
INTRAMUSCULAR | Status: DC | PRN
  Administered 2023-08-30: 10 mg via INTRAVENOUS

## 2023-08-30 MED ORDER — DIBUCAINE (PERIANAL) 1 % EX OINT: 1.0000 | TOPICAL_OINTMENT | CUTANEOUS | Status: DC | PRN

## 2023-08-30 MED ORDER — WITCH HAZEL-GLYCERIN EX PADS: 1.0000 | MEDICATED_PAD | CUTANEOUS | Status: DC | PRN

## 2023-08-30 MED ORDER — ACETAMINOPHEN 325 MG PO TABS: 325.0000 mg | ORAL_TABLET | ORAL | Status: DC | PRN

## 2023-08-30 MED ORDER — SIMETHICONE 80 MG PO CHEW: 80.0000 mg | CHEWABLE_TABLET | ORAL | Status: DC | PRN

## 2023-08-30 MED ORDER — FENTANYL CITRATE (PF) 100 MCG/2ML IJ SOLN
INTRAMUSCULAR | Status: AC
  Filled 2023-08-30: qty 2

## 2023-08-30 MED ORDER — FENTANYL CITRATE (PF) 100 MCG/2ML IJ SOLN: 25.0000 ug | INTRAMUSCULAR | Status: DC | PRN

## 2023-08-30 MED ORDER — SODIUM CHLORIDE 0.9% FLUSH: 3.0000 mL | INTRAVENOUS | Status: DC | PRN

## 2023-08-30 MED ORDER — OXYTOCIN-SODIUM CHLORIDE 30-0.9 UT/500ML-% IV SOLN
INTRAVENOUS | Status: AC
  Filled 2023-08-30: qty 500

## 2023-08-30 MED ORDER — ACETAMINOPHEN 10 MG/ML IV SOLN
INTRAVENOUS | Status: AC
  Filled 2023-08-30: qty 100

## 2023-08-30 MED ORDER — SOD CITRATE-CITRIC ACID 500-334 MG/5ML PO SOLN
ORAL | Status: AC
  Filled 2023-08-30: qty 30

## 2023-08-30 MED ORDER — SODIUM CHLORIDE 0.9 % IR SOLN
Status: DC | PRN
  Administered 2023-08-30: 1000 mL

## 2023-08-30 MED ORDER — PROMETHAZINE HCL 25 MG/ML IJ SOLN: 6.2500 mg | INTRAMUSCULAR | Status: DC | PRN

## 2023-08-30 MED ORDER — DIPHENHYDRAMINE HCL 25 MG PO CAPS: 25.0000 mg | ORAL_CAPSULE | ORAL | Status: DC | PRN

## 2023-08-30 MED ORDER — EPHEDRINE 5 MG/ML INJ
INTRAVENOUS | Status: AC
  Filled 2023-08-30: qty 5

## 2023-08-30 MED ORDER — LACTATED RINGERS IV SOLN: INTRAVENOUS | Status: DC

## 2023-08-30 MED ORDER — EPHEDRINE SULFATE-NACL 50-0.9 MG/10ML-% IV SOSY
PREFILLED_SYRINGE | INTRAVENOUS | Status: DC | PRN
  Administered 2023-08-30 (×2): 5 mg via INTRAVENOUS

## 2023-08-30 MED ORDER — TRANEXAMIC ACID 1000 MG/10ML IV SOLN: 1.5000 mg/kg/h | INTRAVENOUS | Status: DC

## 2023-08-30 MED ORDER — MEPERIDINE HCL 25 MG/ML IJ SOLN: 6.2500 mg | INTRAMUSCULAR | Status: DC | PRN

## 2023-08-30 MED ORDER — ACETAMINOPHEN 160 MG/5ML PO SOLN: 325.0000 mg | ORAL | Status: DC | PRN

## 2023-08-30 MED ORDER — BUPIVACAINE IN DEXTROSE 0.75-8.25 % IT SOLN
INTRATHECAL | Status: DC | PRN
  Administered 2023-08-30: 1.6 mL via INTRATHECAL

## 2023-08-30 MED ORDER — ONDANSETRON HCL 4 MG/2ML IJ SOLN: 4.0000 mg | Freq: Three times a day (TID) | INTRAMUSCULAR | Status: DC | PRN

## 2023-08-30 MED ORDER — STERILE WATER FOR IRRIGATION IR SOLN
Status: DC | PRN
  Administered 2023-08-30: 1000 mL

## 2023-08-30 MED ORDER — SOD CITRATE-CITRIC ACID 500-334 MG/5ML PO SOLN
30.0000 mL | ORAL | Status: AC
  Administered 2023-08-30: 30 mL via ORAL

## 2023-08-30 MED ORDER — ONDANSETRON HCL 4 MG/2ML IJ SOLN
INTRAMUSCULAR | Status: AC
  Filled 2023-08-30: qty 2

## 2023-08-30 MED ORDER — ONDANSETRON HCL 4 MG/2ML IJ SOLN
INTRAMUSCULAR | Status: DC | PRN
  Administered 2023-08-30: 4 mg via INTRAVENOUS

## 2023-08-30 MED ORDER — TRANEXAMIC ACID-NACL 1000-0.7 MG/100ML-% IV SOLN
INTRAVENOUS | Status: AC
  Filled 2023-08-30: qty 100

## 2023-08-30 MED ORDER — OXYTOCIN-SODIUM CHLORIDE 30-0.9 UT/500ML-% IV SOLN: 2.5000 [IU]/h | INTRAVENOUS | Status: AC

## 2023-08-30 MED ORDER — SCOPOLAMINE 1 MG/3DAYS TD PT72
MEDICATED_PATCH | TRANSDERMAL | Status: AC
  Filled 2023-08-30: qty 1

## 2023-08-30 MED ORDER — OXYTOCIN-SODIUM CHLORIDE 30-0.9 UT/500ML-% IV SOLN
INTRAVENOUS | Status: DC | PRN
  Administered 2023-08-30: 300 mL via INTRAVENOUS

## 2023-08-30 MED ORDER — PHENYLEPHRINE HCL-NACL 20-0.9 MG/250ML-% IV SOLN
INTRAVENOUS | Status: AC
  Filled 2023-08-30: qty 250

## 2023-08-30 MED ORDER — DIPHENHYDRAMINE HCL 25 MG PO CAPS: 25.0000 mg | ORAL_CAPSULE | Freq: Four times a day (QID) | ORAL | Status: DC | PRN

## 2023-08-30 MED ORDER — ENOXAPARIN SODIUM 60 MG/0.6ML IJ SOSY
50.0000 mg | PREFILLED_SYRINGE | INTRAMUSCULAR | Status: DC
  Administered 2023-08-31 – 2023-09-02 (×3): 50 mg via SUBCUTANEOUS
  Filled 2023-08-30 (×3): qty 0.6

## 2023-08-30 MED ORDER — PHENYLEPHRINE HCL-NACL 20-0.9 MG/250ML-% IV SOLN
INTRAVENOUS | Status: DC | PRN
  Administered 2023-08-30: 25 ug/min via INTRAVENOUS

## 2023-08-30 MED ORDER — FENTANYL CITRATE (PF) 100 MCG/2ML IJ SOLN
INTRAMUSCULAR | Status: DC | PRN
  Administered 2023-08-30: 15 ug via INTRATHECAL

## 2023-08-30 MED ORDER — ZOLPIDEM TARTRATE 5 MG PO TABS: 5.0000 mg | ORAL_TABLET | Freq: Every evening | ORAL | Status: DC | PRN

## 2023-08-30 MED ORDER — PHENYLEPHRINE 80 MCG/ML (10ML) SYRINGE FOR IV PUSH (FOR BLOOD PRESSURE SUPPORT)
PREFILLED_SYRINGE | INTRAVENOUS | Status: AC
  Filled 2023-08-30: qty 10

## 2023-08-30 MED ORDER — DEXAMETHASONE SODIUM PHOSPHATE 10 MG/ML IJ SOLN
INTRAMUSCULAR | Status: AC
  Filled 2023-08-30: qty 1

## 2023-08-30 MED ORDER — ACETAMINOPHEN 10 MG/ML IV SOLN: 1000.0000 mg | Freq: Once | INTRAVENOUS | Status: DC | PRN

## 2023-08-30 MED ORDER — IBUPROFEN 600 MG PO TABS
600.0000 mg | ORAL_TABLET | Freq: Four times a day (QID) | ORAL | Status: DC | PRN
  Administered 2023-08-31 – 2023-09-02 (×9): 600 mg via ORAL
  Filled 2023-08-30 (×10): qty 1

## 2023-08-30 MED ORDER — MORPHINE SULFATE (PF) 0.5 MG/ML IJ SOLN
INTRAMUSCULAR | Status: DC | PRN
  Administered 2023-08-30: 150 ug via INTRATHECAL

## 2023-08-30 MED ORDER — TETANUS-DIPHTH-ACELL PERTUSSIS 5-2.5-18.5 LF-MCG/0.5 IM SUSY: 0.5000 mL | PREFILLED_SYRINGE | Freq: Once | INTRAMUSCULAR | Status: DC

## 2023-08-30 MED ORDER — CEFAZOLIN SODIUM-DEXTROSE 2-4 GM/100ML-% IV SOLN
INTRAVENOUS | Status: AC
  Filled 2023-08-30: qty 100

## 2023-08-30 MED ORDER — NALOXONE HCL 0.4 MG/ML IJ SOLN: 0.4000 mg | INTRAMUSCULAR | Status: DC | PRN

## 2023-08-30 SURGICAL SUPPLY — 37 items
APL PRP STRL LF DISP 70% ISPRP (MISCELLANEOUS) ×2
APL SKNCLS STERI-STRIP NONHPOA (GAUZE/BANDAGES/DRESSINGS) ×1
BENZOIN TINCTURE PRP APPL 2/3 (GAUZE/BANDAGES/DRESSINGS) ×1 IMPLANT
CHLORAPREP W/TINT 26 (MISCELLANEOUS) ×2 IMPLANT
CLAMP UMBILICAL CORD (MISCELLANEOUS) ×1 IMPLANT
CLOTH BEACON ORANGE TIMEOUT ST (SAFETY) ×1 IMPLANT
DRAIN JACKSON PRT FLT 10 (DRAIN) IMPLANT
DRSG OPSITE POSTOP 4X10 (GAUZE/BANDAGES/DRESSINGS) ×1 IMPLANT
ELECT REM PT RETURN 9FT ADLT (ELECTROSURGICAL) ×1
ELECTRODE REM PT RTRN 9FT ADLT (ELECTROSURGICAL) ×1 IMPLANT
EVACUATOR SILICONE 100CC (DRAIN) IMPLANT
EXTRACTOR VACUUM M CUP 4 TUBE (SUCTIONS) IMPLANT
GLOVE BIO SURGEON STRL SZ 6.5 (GLOVE) ×1 IMPLANT
GLOVE BIOGEL PI IND STRL 7.0 (GLOVE) ×2 IMPLANT
GOWN STRL REUS W/TWL LRG LVL3 (GOWN DISPOSABLE) ×2 IMPLANT
KIT ABG SYR 3ML LUER SLIP (SYRINGE) IMPLANT
NDL HYPO 25X5/8 SAFETYGLIDE (NEEDLE) IMPLANT
NEEDLE HYPO 25X5/8 SAFETYGLIDE (NEEDLE) IMPLANT
NS IRRIG 1000ML POUR BTL (IV SOLUTION) ×1 IMPLANT
PACK C SECTION WH (CUSTOM PROCEDURE TRAY) ×1 IMPLANT
PAD OB MATERNITY 4.3X12.25 (PERSONAL CARE ITEMS) ×1 IMPLANT
RETRACTOR TRAXI PANNICULUS (MISCELLANEOUS) IMPLANT
RTRCTR C-SECT PINK 25CM LRG (MISCELLANEOUS) IMPLANT
STRIP CLOSURE SKIN 1/2X4 (GAUZE/BANDAGES/DRESSINGS) ×1 IMPLANT
SUT CHROMIC 0 CT 1 (SUTURE) ×1 IMPLANT
SUT MNCRL AB 3-0 PS2 27 (SUTURE) ×1 IMPLANT
SUT PLAIN 2 0 (SUTURE) ×2
SUT PLAIN 2 0 XLH (SUTURE) ×1 IMPLANT
SUT PLAIN ABS 2-0 CT1 27XMFL (SUTURE) ×2 IMPLANT
SUT SILK 2 0 SH (SUTURE) IMPLANT
SUT VIC AB 0 CTX 36 (SUTURE) ×4
SUT VIC AB 0 CTX36XBRD ANBCTRL (SUTURE) ×4 IMPLANT
SUT VIC AB 2-0 SH 27 (SUTURE)
SUT VIC AB 2-0 SH 27XBRD (SUTURE) IMPLANT
TOWEL OR 17X24 6PK STRL BLUE (TOWEL DISPOSABLE) ×1 IMPLANT
TRAY FOLEY W/BAG SLVR 14FR LF (SET/KITS/TRAYS/PACK) ×1 IMPLANT
WATER STERILE IRR 1000ML POUR (IV SOLUTION) ×1 IMPLANT

## 2023-08-30 NOTE — Op Note (Signed)
Cesarean Section Procedure Note   Autumn Nunez  08/30/2023  Indications: Scheduled Proceedure/Maternal Request and Pt with HSV and desires PCS    Pre-operative Diagnosis: Herpes simplex AT 39 WEEKS  Post-operative Diagnosis: Same   Surgeon: Surgeons and Role:    * Jaymes Graff, MD - Primary   Assistants: Dale Chill[place] CNM .  An assistant was needed for the complexity of the case  Anesthesia: spinal   Procedure Details:  The patient was seen in the Holding Room. The risks, benefits, complications, treatment options, and expected outcomes were discussed with the patient. The patient concurred with the proposed plan, giving informed consent. identified as Autumn Nunez and the procedure verified as C-Section Delivery. A Time Out was held and the above information confirmed.  After induction of anesthesia, the patient was draped and prepped in the usual sterile manner. A transverse incision was made and carried down through the subcutaneous tissue to the fascia. Fascial incision was made in the midline and extended transversely. The fascia was separated from the underlying rectus muscle superiorly and inferiorly. The peritoneum was identified and entered. Peritoneal incision was extended longitudinally with good visualization of bowel and bladder. The utero-vesical peritoneal reflection was incised transversely and the bladder flap was bluntly freed from the lower uterine segment.  An alexsis retractor was placed in the abdomen.   A low transverse uterine incision was made. Delivered from cephalic presentation was a  infant, with Apgar scores of 8 at one minute and 9 at five minutes. Cord ph was not sent the umbilical cord was clamped and cut cord blood was obtained for evaluation. The placenta was removed Intact and appeared normal. The uterine outline, tubes and ovaries appeared normal}. The uterine incision was closed with running locked sutures of 0Vicryl. A second layer 0 vicrlyl was used  to imbricate the uterine incision    Hemostasis was observed. Lavage was carried out until clear. The alexsis was removed.  The peritoneum was closed with 0 chromic.  The muscles were examined and any bleeders were made hemostatic using bovie cautery device.   The fascia was then reapproximated with running sutures of 0 vicryl.  The subcutaneous tissue was reapproximated  With interrupted stitches using 2-0 plain gut. The subcuticular closure was performed using 3-52monocryl     Instrument, sponge, and needle counts were correct prior the abdominal closure and were correct at the conclusion of the case.    Findings: infant was delivered from vtx presentation. The fluid was clear.  The uterus tubes and ovaries appeared normal.     Estimated Blood Loss: 394 cc   Total IV Fluids:   Urine Output:  100CC OF clear urine  Specimens: placenta  Complications: no complications  Disposition: PACU - hemodynamically stable.   Maternal Condition: stable   Baby condition / location:  Couplet care / Skin to Skin  Attending Attestation: I was present and scrubbed for the entire procedure.   Signed: Surgeon(s): Jaymes Graff, MD

## 2023-08-30 NOTE — Anesthesia Postprocedure Evaluation (Signed)
Anesthesia Post Note  Patient: Autumn Nunez  Procedure(s) Performed: CESAREAN SECTION     Patient location during evaluation: PACU Anesthesia Type: Spinal Level of consciousness: oriented and awake and alert Pain management: pain level controlled Vital Signs Assessment: post-procedure vital signs reviewed and stable Respiratory status: spontaneous breathing, respiratory function stable and patient connected to nasal cannula oxygen Cardiovascular status: blood pressure returned to baseline and stable Postop Assessment: no headache, no backache and no apparent nausea or vomiting Anesthetic complications: no  No notable events documented.  Last Vitals:  Vitals:   08/30/23 1656 08/30/23 1707  BP: 100/78 128/68  Pulse: (!) 46 (!) 50  Resp: 14   Temp: 36.6 C 36.4 C  SpO2: 99% 100%    Last Pain:  Vitals:   08/30/23 1710  TempSrc:   PainSc: 0-No pain   Pain Goal:                   Shelton Silvas

## 2023-08-30 NOTE — Transfer of Care (Signed)
Immediate Anesthesia Transfer of Care Note  Patient: Autumn Nunez  Procedure(s) Performed: CESAREAN SECTION  Patient Location: PACU  Anesthesia Type:Spinal  Level of Consciousness: awake, alert , and oriented  Airway & Oxygen Therapy: Patient Spontanous Breathing  Post-op Assessment: Report given to RN and Post -op Vital signs reviewed and stable  Post vital signs: Reviewed and stable  Last Vitals:  Vitals Value Taken Time  BP    Temp    Pulse    Resp    SpO2      Last Pain:  Vitals:   08/30/23 0944  TempSrc: Oral         Complications: No notable events documented.

## 2023-08-30 NOTE — Anesthesia Preprocedure Evaluation (Signed)
Anesthesia Evaluation  Patient identified by MRN, date of birth, ID band Patient awake    Reviewed: Allergy & Precautions, NPO status , Patient's Chart, lab work & pertinent test results  Airway Mallampati: III  TM Distance: >3 FB Neck ROM: Full    Dental  (+) Teeth Intact, Dental Advisory Given   Pulmonary asthma , sleep apnea    breath sounds clear to auscultation       Cardiovascular negative cardio ROS  Rhythm:Regular Rate:Normal     Neuro/Psych  PSYCHIATRIC DISORDERS Anxiety Depression    negative neurological ROS     GI/Hepatic negative GI ROS, Neg liver ROS,,,  Endo/Other  negative endocrine ROS    Renal/GU negative Renal ROS     Musculoskeletal   Abdominal   Peds  Hematology  (+) Blood dyscrasia, anemia   Anesthesia Other Findings Multiple tongue and nose piercing's noted. Risks discussed in detail. Patient accepts risks of proceeding without removal.    Reproductive/Obstetrics                             Anesthesia Physical Anesthesia Plan  ASA: 2  Anesthesia Plan: Spinal   Post-op Pain Management: Ofirmev IV (intra-op)*   Induction:   PONV Risk Score and Plan: 0 and Ondansetron  Airway Management Planned: Natural Airway  Additional Equipment: None  Intra-op Plan:   Post-operative Plan:   Informed Consent: I have reviewed the patients History and Physical, chart, labs and discussed the procedure including the risks, benefits and alternatives for the proposed anesthesia with the patient or authorized representative who has indicated his/her understanding and acceptance.       Plan Discussed with: CRNA  Anesthesia Plan Comments: (Lab Results      Component                Value               Date                      WBC                      5.9                 08/28/2023                HGB                      8.9 (L)             08/28/2023                HCT                       30.4 (L)            08/28/2023                MCV                      85.6                08/28/2023                PLT                      286  08/28/2023            )       Anesthesia Quick Evaluation

## 2023-08-30 NOTE — Lactation Note (Signed)
This note was copied from a baby's chart. Lactation Consultation Note  Patient Name: Autumn Nunez QIONG'E Date: 08/30/2023 Age:27 hours Reason for consult: Initial assessment;Primapara;1st time breastfeeding;Term;Breastfeeding assistance  P1- MOB stated that infant hasn't fed really well since birth. Infant is now 57 hrs old. LC assisted MOB with placing infant in the football hold on the left breast. MOB had infant belly to belly and nose to nipple. MOB was able to express a large drop of colostrum. Infant latched immediately and had a strong rhythmic suck. LC encouraged MOB to allow infant to nurse for however long she wants.  LC reviewed feeding infant on cue 8-12x in 24 hrs, not allowing infant to go over 3 hrs without a feeding, LC services handout and CDC milk storage guidelines. LC praised MOB with her great latching skills. LC also encouraged MOB to call for further assistance as needed.  Maternal Data Has patient been taught Hand Expression?: Yes Does the patient have breastfeeding experience prior to this delivery?: No  Feeding Mother's Current Feeding Choice: Breast Milk  LATCH Score Latch: Grasps breast easily, tongue down, lips flanged, rhythmical sucking.  Audible Swallowing: Spontaneous and intermittent  Type of Nipple: Everted at rest and after stimulation  Comfort (Breast/Nipple): Soft / non-tender  Hold (Positioning): No assistance needed to correctly position infant at breast.  LATCH Score: 10   Interventions Interventions: Breast feeding basics reviewed;Assisted with latch;Breast compression;Adjust position;Support pillows;Position options;Expressed milk;Education;LC Services brochure  Discharge Discharge Education: Warning signs for feeding baby Pump: Hands Free;Personal WIC Program: Yes  Consult Status Consult Status: Follow-up Date: 08/31/23 Follow-up type: In-patient    Dema Severin BS, IBCLC 08/30/2023, 8:46 PM

## 2023-08-30 NOTE — Anesthesia Procedure Notes (Signed)
Spinal  Start time: 08/30/2023 2:00 PM End time: 08/30/2023 2:03 PM Reason for block: surgical anesthesia Staffing Performed: anesthesiologist  Anesthesiologist: Shelton Silvas, MD Performed by: Shelton Silvas, MD Authorized by: Shelton Silvas, MD   Preanesthetic Checklist Completed: patient identified, IV checked, site marked, risks and benefits discussed, surgical consent, monitors and equipment checked, pre-op evaluation and timeout performed Spinal Block Patient position: sitting Prep: DuraPrep and site prepped and draped Location: L3-4 Injection technique: single-shot Needle Needle type: Pencan  Needle gauge: 24 G Needle length: 10 cm Needle insertion depth: 10 cm Additional Notes Patient tolerated well. No immediate complications.  Functioning IV was confirmed and monitors were applied. Sterile prep and drape, including hand hygiene and sterile gloves were used. The patient was positioned and the back was prepped. The skin was anesthetized with lidocaine. Free flow of clear CSF was obtained prior to injecting local anesthetic into the CSF. The spinal needle aspirated freely following injection. The needle was carefully withdrawn. The patient tolerated the procedure well.

## 2023-08-30 NOTE — H&P (Signed)
Autumn Nunez is a 27 y.o. female, G1P0000, IUP at 39 weeks, presenting for Elective primary CS. Pt endorse + Fm. Denies vaginal leakage. Denies vaginal bleeding. Denies feeling cxt's.   Pregnancy Problem List: Anemia (NOB: Hgb 11.0; Rx for Fe.) asthma Bacteriuria (E. 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      valACYclovir (VALTREX) 1000 MG tablet Take 1,000 mg by mouth daily.      ondansetron (ZOFRAN-ODT) 4 MG disintegrating tablet Take 1 tablet (4 mg total) by mouth every 8 (eight) hours as needed for up to 12 doses for nausea or vomiting. (Patient not taking: Reported on 04/24/2023) 12 tablet 0 Not Taking    Past Medical History:   Diagnosis Date   Allergy    Anemia    Anxiety    Asthma    Chlamydia    Depression    Herpes    Recurrent UTI    Sleep apnea    Vitamin D deficiency      No current facility-administered medications on file prior to encounter.   Current Outpatient Medications on File Prior to Encounter  Medication Sig Dispense Refill   Prenatal Vit-Fe Fumarate-FA (PRENATAL MULTIVITAMIN) TABS tablet Take 1 tablet by mouth daily at 12 noon.     valACYclovir (VALTREX) 1000 MG tablet Take 1,000 mg by mouth daily.     ondansetron (ZOFRAN-ODT) 4 MG disintegrating tablet Take 1 tablet (4 mg total) by mouth every 8 (eight) hours as needed for up to 12 doses for nausea or vomiting. (Patient not taking: Reported on 04/24/2023) 12 tablet 0     No Known Allergies  History of present pregnancy: Pt Info/Preference:  Screening/Consents:  Labs:   EDD: Estimated Date of Delivery: 09/06/23  Establised: Patient's last menstrual period was 12/06/2022.  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 VBAC: no RPR: NON REACTIVE (09/27 1010)   Circumcision: ???   HBsAg:    Pediatrician:  ???   HIV: Non-reactive (07/12 0000)   Prenatal Classes: no Additional [place]: 9/6 5.3 lbs 13% SFI 8.3 GBS:  Negative (For PCN allergy, check sensitivities)       Chlamydia: + Jan 2024, TOC neg,     MFM Referral/Consult:  GC: neg  Support Person: partner   PAP: ???  Pain Management: spinal Neonatologist Referral:  Hgb Electrophoresis:  AA  Birth Plan: PCS   Hgb NOB: 11    28W: 9.5   OB History     Gravida   1   Para      Term      Preterm      AB      Living         SAB      IAB      Ectopic      Multiple      Live Births             Past Medical History:  Diagnosis Date   Allergy    Anemia    Anxiety    Asthma    Chlamydia    Depression    Herpes    Recurrent UTI    Sleep apnea    Vitamin D deficiency    Past Surgical History:  Procedure Laterality Date   BIRTH CONTROL IMPLANT  01/29/2013   CYSTO WITH HYDRODISTENSION N/A 04/08/2013   Procedure: CYSTOSCOPY/HYDRODISTENSION;  Surgeon: Kathi Ludwig, MD;  Location: [facility];  Service: Urology;  Laterality: N/A;   CYSTOGRAM N/A 04/08/2013   Procedure: CYSTOGRAM;  Surgeon: Kathi Ludwig, MD;  Location: [facility];  Service: Urology;  Laterality: N/A;   LAPAROSCOPIC GASTRIC SLEEVE RESECTION     TONSILLECTOMY AND ADENOIDECTOMY  age 21   Family History: family history is not on file. Social History:  reports that she has never smoked. She has never used smokeless tobacco. She reports that she does not drink alcohol and does not use drugs.   Prenatal Transfer Tool  Maternal Diabetes: No Genetic Screening: Normal SMA+ Carrier Maternal Ultrasounds/Referrals: Normal Fetal Ultrasounds or other Referrals:  Referred to Materal Fetal Medicine  Maternal Substance Abuse:  No Significant Maternal Medications:  Meds include: Other: valtrex Significant Maternal Lab Results: Group B Strep negative  ROS:  Review of Systems  Constitutional: Negative.   HENT: Negative.    Eyes: Negative.   Respiratory: Negative.    Cardiovascular: Negative.   Gastrointestinal: Negative.   Genitourinary: Negative.   Musculoskeletal: Negative.   Skin: Negative.   Neurological: Negative.   Endo/Heme/Allergies: Negative.   Psychiatric/Behavioral: Negative.       Physical Exam: LMP 12/06/2022   Physical Exam Vitals and nursing note reviewed.  Constitutional:      Appearance: Normal  appearance.  HENT:     Head: Normocephalic and atraumatic.     Nose: Nose normal.     Mouth/Throat:     Mouth: Mucous membranes are moist.  Eyes:     Conjunctiva/sclera: Conjunctivae normal.  Cardiovascular:     Rate and Rhythm: Normal rate and regular rhythm.     Pulses: Normal pulses.     Heart sounds: Normal heart sounds.  Pulmonary:  Effort: Pulmonary effort is normal.     Breath sounds: Normal breath sounds.  Abdominal:     General: Bowel sounds are normal.  Genitourinary:    General: Normal vulva.     Rectum: Normal.  Musculoskeletal:        General: Normal range of motion.     Cervical back: Normal range of motion and neck supple.  Skin:    General: Skin is warm.     Capillary Refill: Capillary refill takes less than 2 seconds.  Neurological:     General: No focal deficit present.     Mental Status: She is alert.  Psychiatric:        Mood and Affect: Mood normal.      FHT: 142 Deferred vaginal exam.   Labs: No results found for this or any previous visit (from the past 24 hour(s)).  Imaging:  [place] MFM OB FOLLOW UP  Result Date: 08/07/2023 ----------------------------------------------------------------------  OBSTETRICS REPORT                       (Signed Final 08/07/2023 09:46 am) ---------------------------------------------------------------------- Patient Info  ID #:       [id number]                          D.O.B.:  [birth year] (27 yrs)  Name:       Autumn Nunez                 Visit Date: 08/07/2023 08:42 am ---------------------------------------------------------------------- Performed By  Attending:        Noralee Space MD        Ref. Address:     [facility]                                                             Gynecology                                                             [address]  [address]                                                             [place], [place]                                                             [address]  Performed By:     Alain Marion     [facility]  Referred By:      Jaymes Graff                    MD ---------------------------------------------------------------------- Orders  #  Description                           Code        Ordered By  1  [place] MFM OB FOLLOW UP                   60454.09    Noralee Space ----------------------------------------------------------------------  #  Order #                     Accession #                Episode #  1  [id number]                   [id number]                 [id number] ---------------------------------------------------------------------- Indications  Obesity complicating pregnancy, second         O99.212  trimester(PG BMI - 30.02)  Pregnancy complicated by previous gastric      O99.842  bypass, antepartum, second trimester  Echogenic intracardiac focus of the heart      O35.8XX0  (EIF)  Genetic carrier (Increased carrier risk for    Z14.8  SMA)  LR NIPT, Declined AFP  [redacted] weeks gestation of pregnancy  Z3A.35 ---------------------------------------------------------------------- Fetal Evaluation  Num Of Fetuses:         1  Fetal Heart Rate(bpm):  142  Cardiac Activity:       Observed  Presentation:           Cephalic  Placenta:               Anterior  Amniotic Fluid  AFI FV:      Within normal limits  AFI Sum(cm)     %Tile       Largest Pocket(cm)  8.36            9           3.02  RUQ(cm)       RLQ(cm)       LUQ(cm)        LLQ(cm)  1.6           3.02          2.43           1.31  ---------------------------------------------------------------------- Biometry  BPD:      82.5  mm     G. Age:  33w 1d        3.7  %    CI:        74.66   %    70 - 86                                                          FL/HC:      21.8   %    20.1 - 22.1  HC:       303   mm     G. Age:  33w 5d          1  %    HC/AC:      0.99        0.93 - 1.11  AC:       305   mm     G. Age:  34w 3d         23  %    FL/BPD:     80.2   %    71 - 87  FL:       66.2  mm     G. Age:  34w 1d         10  %    FL/AC:      21.7   %    20 - 24  LV:        7.1  mm  Est. FW:    2359  gm      5 lb 3 oz     13  % ---------------------------------------------------------------------- OB History  Gravidity:    1         Term:   0        Prem:   0        SAB:   0  TOP:          0       Ectopic:  0        Living: 0 ---------------------------------------------------------------------- Gestational Age  LMP:           35w 5d        Date:  11/30/22  EDD:   09/06/23  U/S Today:     33w 6d                                        EDD:   09/19/23  Best:          35w 5d     Det. By:  LMP  (11/30/22)          EDD:   09/06/23 ---------------------------------------------------------------------- Anatomy  Cranium:               Appears normal         Stomach:                Appears normal, left                                                                        sided  Ventricles:            Appears normal         Kidneys:                Appear normal  Choroid Plexus:        Appears normal         Bladder:                Appears normal  Other:  Anatomy previously seen. ---------------------------------------------------------------------- Impression  History of bariatric surgery.  Patient reports she does not  have gestational diabetes.  Fetal growth is appropriate for gestational age.  Neotic fluid is  normal and good fetal activity seen.  Cephalic presentation.  Patient reports she will be undergoing elective cesarean  delivery at [redacted]  weeks gestation. ---------------------------------------------------------------------- Recommendations  -No follow-up appointments were made . ----------------------------------------------------------------------                 Noralee Space, MD Electronically Signed Final Report   08/07/2023 09:46 am ----------------------------------------------------------------------    MAU Course: Orders Placed This Encounter  Procedures   ABO/Rh   No orders of the defined types were placed in this encounter.   Assessment/Plan: ANSLIE SPADAFORA is a 27 y.o. female, G1P0000, IUP at 37 weeks, presenting for Elective primary CS. Pt endorse + Fm. Denies vaginal leakage. Denies vaginal bleeding. Denies feeling cxt's.   Pregnancy Problem List: Anemia (NOB: Hgb 11.0; Rx for Fe.) asthma Bacteriuria (E. 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  GET FOB TESTED) vitamin D deficiency (Rx for supplement, recheck pp.)  Plan: Admit to Birthing Suite per consult with Dr Normand Sloop Anticipate Elective PC SCD Bicitra LR with 125mg  IV Shave Site Foley Ancef 3gm.    [facility] CNM, FNP-C, PMHNP-BC  [address]  [place], [place] [address]  Cell: [phone]  Office Phone: [phone] Fax: [phone] 08/30/2023  9:43 AM

## 2023-08-31 ENCOUNTER — Other Ambulatory Visit: Payer: Self-pay

## 2023-08-31 ENCOUNTER — Encounter (HOSPITAL_COMMUNITY): Payer: Self-pay | Admitting: Obstetrics and Gynecology

## 2023-08-31 LAB — CBC
HCT: 26.5 % — ABNORMAL LOW (ref 36.0–46.0)
Hemoglobin: 8 g/dL — ABNORMAL LOW (ref 12.0–15.0)
MCH: 25.2 pg — ABNORMAL LOW (ref 26.0–34.0)
MCHC: 30.2 g/dL (ref 30.0–36.0)
MCV: 83.3 fL (ref 80.0–100.0)
Platelets: 296 10*3/uL (ref 150–400)
RBC: 3.18 MIL/uL — ABNORMAL LOW (ref 3.87–5.11)
RDW: 13 % (ref 11.5–15.5)
WBC: 11.5 10*3/uL — ABNORMAL HIGH (ref 4.0–10.5)
nRBC: 0 % (ref 0.0–0.2)

## 2023-08-31 MED ORDER — POLYSACCHARIDE IRON COMPLEX 150 MG PO CAPS
150.0000 mg | ORAL_CAPSULE | Freq: Every day | ORAL | Status: DC
  Administered 2023-08-31: 150 mg via ORAL
  Filled 2023-08-31 (×2): qty 1

## 2023-08-31 MED ORDER — MEASLES, MUMPS & RUBELLA VAC IJ SOLR: 0.5000 mL | Freq: Once | INTRAMUSCULAR | Status: DC

## 2023-08-31 NOTE — Lactation Note (Signed)
This note was copied from a baby's chart. Lactation Consultation Note  Patient Name: Autumn Nunez BJYNW'[id number] Date: 08/31/2023 Age:27 hours Reason for consult: Follow-up assessment;1st time breastfeeding;Term (Birth Parent with C/S delivery see MR) P1, female infant with -2.52% weight loss, Per Birth Parent, BF is going well no concerns for LC at this time. Infant recently BF for 10 minutes prior to [facility] entering the room. Infant had 2 voids and one stool today. Birth Parent is BF by cues, on demand, every 2-3 hours, skin to skin.   Maternal Data    Feeding Mother's Current Feeding Choice: Breast Milk  LATCH Score  LC did not observe latch, infant had finished breastfeeding prior to [facility] entering the room.                  Lactation Tools Discussed/Used    Interventions    Discharge    Consult Status Consult Status: Follow-up Date: 09/01/23 Follow-up type: In-patient    Frederico Hamman 08/31/2023, 8:25 PM

## 2023-08-31 NOTE — Progress Notes (Addendum)
Subjective: Postpartum Day 1: Cesarean Delivery Patient reports tolerating PO and no problems voiding.  She has had small vaginal bleeding. Her incisional pain is well controlled. She is breastfeeding.   Objective: Vital signs in last 24 hours: Temp:  [97.6 F (36.4 C)-98.8 F (37.1 C)] 98.8 F (37.1 C) (09/30 0500) Pulse Rate:  [46-53] 52 (09/30 0500) Resp:  [12-22] 18 (09/30 0500) BP: (100-133)/(42-80) 126/54 (09/30 0500) SpO2:  [93 %-100 %] 100 % (09/29 1825)  Physical Exam:  General: alert, cooperative, and no distress Lochia: appropriate Uterine Fundus: firm Incision: With pressure dressing, clean, dry and intact.  DVT Evaluation: No evidence of DVT seen on physical exam. Calf/Ankle edema is present.  Recent Labs    08/30/23 1807 08/31/23 0500  HGB 9.1* 8.0*  HCT 30.7* 26.5*    Assessment/Plan: 27 y/o G1P1 POD # 1 Status post Cesarean section. Doing well postoperatively.  Continue current care. Iron tabs for asymptomatic anemia, patient declines IV iron at this time.   Prescilla Sours, MD 08/31/2023, 12:01 PM.

## 2023-08-31 NOTE — Social Work (Signed)
CSW received consult for hx of Anxiety and Depression.  CSW met with MOB to offer support and complete assessment. CSW entered the room and noticed several room guest at bedside, and MOB in bed holding the infant.  CSW introduced self, CSW role and reason for visit, MOB was agreeable to visit. CSW offered privacy and offered to come back at a later time to complete the assessment, MOB allowed CSW to continue with the assessment with room guest present. CSW inquired about how MOB was feeling, MOB reported good. CSW inquired about MOB MH hx, MOB reported she was diagnosed in 2019. MOB reported she doesn no take any medication or therapy, MOB reported she keeps herself bust and talks to her supports to cope. CSW assessed fro safety, MOB denied any SI or HI. CSW provided education regarding the baby blues period vs. perinatal mood disorders, discussed treatment and gave resources for mental health follow up if concerns arise.  CSW recommends self-evaluation during the postpartum time period using the New Mom Checklist from Postpartum Progress and encouraged MOB to contact a medical professional if symptoms are noted at any time. MOB identified her mom as her support.   CSW provided review of Sudden Infant Death Syndrome (SIDS) precautions.  MOB reported she has all necessary items for the infant including a bassinet and carseat.  CSW identifies no further need for intervention and no barriers to discharge at this time.  Wende Neighbors, LCSWA Clinical Social Worker [phone]

## 2023-08-31 NOTE — Progress Notes (Signed)
Patient rubella non immune. Patient declines MMR vaccine. Maxwell Caul, Leretha Dykes [place]

## 2023-09-01 LAB — SURGICAL PATHOLOGY

## 2023-09-01 MED ORDER — SODIUM CHLORIDE 0.9 % IV SOLN: 300.0000 mg | Freq: Once | INTRAVENOUS | Status: DC

## 2023-09-01 MED ORDER — SODIUM CHLORIDE 0.9 % IV SOLN
500.0000 mg | Freq: Once | INTRAVENOUS | Status: DC
  Filled 2023-09-01: qty 25

## 2023-09-01 MED ORDER — IRON SUCROSE 500 MG IVPB - SIMPLE MED
500.0000 mg | Freq: Once | INTRAVENOUS | Status: AC
  Administered 2023-09-01: 500 mg via INTRAVENOUS
  Filled 2023-09-01: qty 500

## 2023-09-01 NOTE — Discharge Instructions (Signed)
Autumn Nunez, 1. Do not do any heavy lifting, i.e nothing heavier than 15 lbs for the next 6 weeks.  2.  Do not use tampons or douche or take baths, do not have any sexual intercourse or anything inside the vagina for the next 6 weeks.  3. Take your pain medication as needed for pain, let us know if the pain is not well controlled despite pain medication use.  4. Take your iron tablets daily for anemia.  You may also take a stool softener e.g colace if you are constipated.    5.  If you get a fever while at home, do check your temperature and if it is equal to or greater than 100.4 please call the office.   6. Some vaginal bleeding is expected and normal after your delivery. Please let us know if if it excessive where you saturate 1 pad in less than 2 hours or so.  7. Please let us know if with depression or anxiety symptoms, or symptoms of uncontrolled blood pressure such as headache, vision changes, nausea, vomiting, chest pain, shortness of breath. 8.  While at home remember to walk regularly, at least half an hour to1 hour a day in addition to your activities of daily living, as this will help with your quick recovery 9.  Remove the Honey comb dressing one week from your surgery date or on the date indicated on the dressing. To remove the honey comb dressing, first take a shower.  After showering pat the dressing dry then peel it off gently from the corners.  After honey comb comes out there will be steri strips left on the incision.  You may continue showering as usual and the steri strips may get wet.  After showering pat the steri strips dry. Allow the steri strips to fall off by themselves over time.  Do not rub the incision directly or put soap directly over the incision.  Always rinse off any soap over the incision and pat the incision dry.   10.  Please call use if with any questions or concerns.   [facility] Phone: [phone].

## 2023-09-01 NOTE — Progress Notes (Signed)
Pt declined oral iron- Niferex. Pt  states she declined IV Iron infusion yesterday but now would prefer the infusion option and will speak with provider during rounds.

## 2023-09-01 NOTE — Progress Notes (Signed)
Subjective: Postpartum Day 2: Cesarean Delivery Patient reports tolerating PO, + flatus, + BM and no problems voiding.    Objective: Vital signs in last 24 hours: Temp:  [97.7 F (36.5 C)-98.2 F (36.8 C)] 98.1 F (36.7 C) (10/01 0612) Pulse Rate:  [60-70] 60 (10/01 0612) Resp:  [16-18] 16 (10/01 0612) BP: (120-127)/(67-76) 120/76 (10/01 0612) SpO2:  [100 %] 100 % (09/30 2033)  Physical Exam:  General: alert Lochia: appropriate Uterine Fundus: firm Incision: healing well, no significant drainage DVT Evaluation: No evidence of DVT seen on physical exam.  Recent Labs    08/30/23 1807 08/31/23 0500  HGB 9.1* 8.0*  HCT 30.7* 26.5*    Assessment/Plan: Status post Cesarean section. Doing well postoperatively.  Continue current care. Venofer pt unable to tolerate po iron She desires to leave tomorrow Autumn Nunez A Autumn Nunez 09/01/2023, 2:12 PM

## 2023-09-02 ENCOUNTER — Other Ambulatory Visit (HOSPITAL_COMMUNITY): Payer: Self-pay

## 2023-09-02 MED ORDER — POLYSACCHARIDE IRON COMPLEX 150 MG PO CAPS: 150.0000 mg | ORAL_CAPSULE | Freq: Every day | ORAL | Status: DC

## 2023-09-02 MED ORDER — IBUPROFEN 600 MG PO TABS
600.0000 mg | ORAL_TABLET | Freq: Four times a day (QID) | ORAL | 0 refills | Status: AC | PRN
  Filled 2023-09-02: qty 30, 8d supply, fill #0

## 2023-09-02 MED ORDER — OXYCODONE HCL 5 MG PO TABS
5.0000 mg | ORAL_TABLET | ORAL | 0 refills | Status: AC | PRN
  Filled 2023-09-02: qty 30, 3d supply, fill #0

## 2023-09-02 MED ORDER — IRON POLYSACCH CMPLX-B12-FA 150-0.025-1 MG PO CAPS
ORAL_CAPSULE | Freq: Every day | ORAL | 0 refills | Status: AC
  Filled 2023-09-02: qty 30, 30d supply, fill #0

## 2023-09-02 MED ORDER — OXYCODONE HCL 5 MG PO TABS: 5.0000 mg | ORAL_TABLET | ORAL | Status: DC | PRN

## 2023-09-02 NOTE — Discharge Summary (Addendum)
Postpartum Discharge Summary    Patient Name: Autumn Nunez DOB: [birth year] MRN: [id number]  Date of admission: 08/30/2023 Delivery date:08/30/2023 Delivering provider: Jaymes Graff Date of discharge: 09/02/2023  Admitting diagnosis: Pregnancy [Z34.90] S/P C-section [Z98.891] Intrauterine pregnancy: [redacted]w[redacted]d     Secondary diagnosis:  Principal Problem:   Pregnancy Active Problems:   Delivery by elective cesarean section   S/P C-section Iron deficiency Anemia.   Discharge diagnosis: Term Pregnancy Delivered                                              Post partum procedures: None Augmentation: N/A Complications: None  Hospital course: Sceduled C/S   27 y.o. yo G1P1001 at [redacted]w[redacted]d was admitted to the hospital 08/30/2023 for scheduled cesarean section with the following indication:Elective Primary.Delivery details are as follows:  Membrane Rupture Time/Date: 2:40 PM,08/30/2023  Delivery Method:C-Section, Low Transverse Operative Delivery:N/A Details of operation can be found in separate operative note.  Patient had a postpartum course complicated by anemia for which she received IV venofer.  She is ambulating, tolerating a regular diet, passing flatus, and urinating well. Patient is discharged home in stable condition on  09/02/23        Newborn Data: Birth date:08/30/2023 Birth time:2:41 PM Gender:Female Living status:Living Apgars:8 ,8  Weight:2780 g    Transfusion:Yes  Immunizations received: Immunization History  Administered Date(s) Administered   PFIZER(Purple Top)SARS-COV-2 Vaccination 03/30/2020, 04/25/2020    Physical exam  Vitals:   09/01/23 1513 09/01/23 1622 09/01/23 2014 09/02/23 0539  BP: 139/69 136/73 122/71 127/74  Pulse: (!) 57 69 70 61  Resp: 16  17 18   Temp: 99.2 F (37.3 C)  99.4 F (37.4 C) 98.9 F (37.2 C)  TempSrc: Oral  Oral Oral  SpO2:   100% 100%   General: alert, cooperative, and no distress Lochia: appropriate Uterine Fundus:  firm Incision: Dressing is clean, dry, and intact DVT Evaluation: No evidence of DVT seen on physical exam. Calf/Ankle edema is present Labs: Lab Results  Component Value Date   WBC 11.5 (H) 08/31/2023   HGB 8.0 (L) 08/31/2023   HCT 26.5 (L) 08/31/2023   MCV 83.3 08/31/2023   PLT 296 08/31/2023      Latest Ref Rng & Units 08/30/2023    6:07 PM  CMP  Creatinine 0.44 - 1.00 mg/dL 2.95    Edinburgh Score:    08/30/2023    5:10 PM  Edinburgh Postnatal Depression Scale Screening Tool  I have been able to laugh and see the funny side of things. 0  I have looked forward with enjoyment to things. 0  I have blamed myself unnecessarily when things went wrong. 1  I have been anxious or worried for no good reason. 1  I have felt scared or panicky for no good reason. 1  Things have been getting on top of me. 1  I have been so unhappy that I have had difficulty sleeping. 2  I have felt sad or miserable. 1  I have been so unhappy that I have been crying. 1  The thought of harming myself has occurred to me. 0  Edinburgh Postnatal Depression Scale Total 8   No data recorded  After visit meds:  Allergies as of 09/02/2023   No Known Allergies      Medication List     STOP taking these  medications    ondansetron 4 MG disintegrating tablet Commonly known as: ZOFRAN-ODT   valACYclovir 1000 MG tablet Commonly known as: VALTREX       TAKE these medications    ibuprofen 600 MG tablet Commonly known as: ADVIL Take 1 tablet (600 mg total) by mouth every 6 (six) hours as needed for moderate pain.   iron polysaccharides 150 MG capsule Commonly known as: NIFEREX Take 1 capsule (150 mg total) by mouth daily for 42 doses. Start taking on: September 03, 2023   oxyCODONE 5 MG immediate release tablet Commonly known as: Oxy IR/ROXICODONE Take 1-2 tablets (5-10 mg total) by mouth every 4 (four) hours as needed for moderate pain.   prenatal multivitamin Tabs tablet Take 1 tablet by  mouth daily at 12 noon.        Discharge home in stable condition Infant Feeding: Breast Infant Disposition:home with mother Discharge instruction: per After Visit Summary and Postpartum booklet. Activity: Advance as tolerated. Pelvic rest for 6 weeks.  Diet: routine diet Future Appointments: Future Appointments  Date Time Provider Department Center  11/17/2023  9:30 AM Terri Piedra, DO [facility] None   Follow up Visit:  Follow-up Information     [facility], [facility]. Schedule an appointment as soon as possible for a visit in 1 week(s).   Specialty: Obstetrics and Gynecology Why: 1 week for mood check and incision check. 6 weeks for postpartum check. Contact information: [address] [place] [address] [phone]                Anticipated Birth Control:  Posey Rea  09/02/2023 Prescilla Sours, MD

## 2023-09-02 NOTE — Lactation Note (Signed)
This note was copied from a baby's chart. Lactation Consultation Note  Patient Name: Autumn Nunez [id number] Date: 09/02/2023 Age:27 hours LC entered room Birth Parent and infant asleep at this time. LC services will follow up with family in the morning.    Maternal Data    Feeding    LATCH Score                    Lactation Tools Discussed/Used    Interventions    Discharge    Consult Status      Autumn Nunez 09/02/2023, 12:50 AM

## 2023-09-02 NOTE — Lactation Note (Signed)
This note was copied from a baby's chart. Lactation Consultation Note  Patient Name: Autumn Nunez [id number] Date: 09/02/2023 Age:27 hours Reason for consult: Follow-up assessment;Primapara;1st time breastfeeding;Term  P1- MOB stated infant is still feeding very well and causing no pain or discomfort. LC noted that infant did have a 6% weight loss yesterday, but the weight has already started going back up today at 5%. LC encouraged MOB to continue feeding infant on cue 8-12x in 24 hrs and not allowing infant to go over 3 hrs without a feeding.  MOB denies having any questions or concerns at this moment. LC reviewed LC services handout, engorgement/breast care, and CDC milk storage guidelines. LC encouraged MOB to call for further assistance as needed.  Maternal Data Does the patient have breastfeeding experience prior to this delivery?: No  Feeding Mother's Current Feeding Choice: Breast Milk  Lactation Tools Discussed/Used Tools: Pump Breast pump type: Manual Pump Education: Setup, frequency, and cleaning;Milk Storage Reason for Pumping: MOB requested  Interventions Interventions: Breast feeding basics reviewed;Education;LC Services brochure  Discharge Discharge Education: Engorgement and breast care;Warning signs for feeding baby Pump: Hands Free;Personal WIC Program: Yes  Consult Status Consult Status: Complete Date: 09/02/23    Autumn Nunez BS, IBCLC 09/02/2023, 11:38 AM

## 2023-09-25 ENCOUNTER — Telehealth (HOSPITAL_COMMUNITY): Payer: Self-pay | Admitting: *Deleted

## 2023-09-25 NOTE — Telephone Encounter (Signed)
09/25/2023  Name: Autumn Nunez MRN: [id number] DOB: [birth year]  Reason for Call:  Transition of Care Hospital Discharge Call  Contact Status: Patient Contact Status: Complete  Language assistant needed: Interpreter Mode: Interpreter Not Needed        Follow-Up Questions: Do You Have Any Concerns About Your Health As You Heal From Delivery?: No Do You Have Any Concerns About Your Infants Health?: No  Edinburgh Postnatal Depression Scale:  In the Past 7 Days:    PHQ2-9 Depression Scale:     Discharge Follow-up: Edinburgh score requires follow up?:  (declined screening today, took with another provider earlier this week - no concerns, patient endorses doing well emotionally) Patient was advised of the following resources:: Breastfeeding Support Group, Support Group (decline postpartum group information via email)  Post-discharge interventions: Reviewed Newborn Safe Sleep Practices  Salena Saner, RN 09/25/2023 10:24

## 2023-11-17 ENCOUNTER — Ambulatory Visit: Payer: Medicaid Other | Admitting: Dermatology

## 2024-04-14 ENCOUNTER — Ambulatory Visit: Payer: Medicaid Other | Admitting: Dermatology

## 2024-04-19 ENCOUNTER — Ambulatory Visit (INDEPENDENT_AMBULATORY_CARE_PROVIDER_SITE_OTHER): Admitting: Radiology

## 2024-04-19 ENCOUNTER — Ambulatory Visit
Admission: EM | Admit: 2024-04-19 | Discharge: 2024-04-19 | Disposition: A | Discharge status: Home / Self Care | Attending: Family Medicine | Admitting: Family Medicine

## 2024-04-19 ENCOUNTER — Encounter: Payer: Self-pay | Admitting: Emergency Medicine

## 2024-04-19 DIAGNOSIS — S62525A Nondisplaced fracture of distal phalanx of left thumb, initial encounter for closed fracture: Secondary | ICD-10-CM

## 2024-04-19 NOTE — Discharge Instructions (Addendum)
 You have fractured the tip of your finger. You may take tylenol  every 6 hours as needed for aches and pains. Follow-up with [facility] for ongoing evaluation as needed should your pain worsen.   If you develop any new or worsening symptoms or if your symptoms do not start to improve, please return here or follow-up with your primary care provider. If your symptoms are severe, please go to the emergency room.

## 2024-04-19 NOTE — ED Provider Notes (Signed)
 Autumn Nunez    CSN: [id number] Arrival date & time: 04/19/24  1506      History   Chief Complaint Chief Complaint  Patient presents with   Hand Pain    HPI Autumn Nunez is a 28 y.o. female.   Patient presents to urgent care for evaluation of pain to the DIP of the left thumb that started last week after she accidentally struck the left thumb on a hand rail approximately 6 to 7 days ago.  Pain is worsened by active range of motion of the left thumb at the DIP joint.  She feels pain and pressure from the DIP joint proximally to the first MCP of the left hand with active range of motion of the left thumb.  Denies numbness and tingling distally to injury and previous injury to the left hand.  She has been taking over the counter medications with some relief.    Hand Pain    Past Medical History:  Diagnosis Date   Allergy    Anemia    Anxiety    Asthma    Chlamydia    Depression    Herpes    Recurrent UTI    Sleep apnea    Vitamin D deficiency     Patient Active Problem List   Diagnosis Date Noted   Delivery by elective cesarean section 08/30/2023   Pregnancy 08/30/2023   S/P C-section 08/30/2023   History of gastric bypass 07/31/2023   Carrier of spinal muscular atrophy 04/22/2023   Obesity, morbid, BMI 40.0-49.9 (HCC) 11/20/2013    Past Surgical History:  Procedure Laterality Date   BIRTH CONTROL IMPLANT  01/29/2013   CESAREAN SECTION N/A 08/30/2023   Procedure: CESAREAN SECTION;  Surgeon: Marylu Soda, MD;  Location: [facility];  Service: Obstetrics;  Laterality: N/A;  Need Fellow MD to assist.   CYSTO WITH HYDRODISTENSION N/A 04/08/2013   Procedure: CYSTOSCOPY/HYDRODISTENSION;  Surgeon: Edmund Gouge, MD;  Location: [facility];  Service: Urology;  Laterality: N/A;   CYSTOGRAM N/A 04/08/2013   Procedure: CYSTOGRAM;  Surgeon: Edmund Gouge, MD;  Location: [facility];  Service: Urology;  Laterality:  N/A;   LAPAROSCOPIC GASTRIC SLEEVE RESECTION     TONSILLECTOMY AND ADENOIDECTOMY  age 38    OB History     Gravida  1   Para  1   Term  1   Preterm      AB      Living  1      SAB      IAB      Ectopic      Multiple  0   Live Births  1            Home Medications    Prior to Admission medications   Medication Sig Start Date End Date Taking? Authorizing Provider  ibuprofen  (ADVIL ) 600 MG tablet Take 1 tablet (600 mg total) by mouth every 6 (six) hours as needed for moderate pain. 09/02/23   Vernal Gold, MD  Iron  Polysacch Cmplx-B12-FA 150-0.025-1 MG CAPS Take 1 capsule by mouth once daily for 42 doses. 09/03/23 10/15/23  Vernal Gold, MD  oxyCODONE  (OXY IR/ROXICODONE ) 5 MG immediate release tablet Take 1-2 tablets (5-10 mg total) by mouth every 4 (four) hours as needed for moderate pain. 09/02/23   Vernal Gold, MD  Prenatal Vit-Fe Fumarate-FA (PRENATAL MULTIVITAMIN) TABS tablet Take 1 tablet by mouth daily at 12 noon.    [provider]  Family History History reviewed. No pertinent family history.  Social History Social History   Tobacco Use   Smoking status: Never   Smokeless tobacco: Never  Vaping Use   Vaping status: Never Used  Substance Use Topics   Alcohol use: No   Drug use: No     Allergies   Patient has no known allergies.   Review of Systems Review of Systems Per HPI  Physical Exam Triage Vital Signs ED Triage Vitals  Encounter Vitals Group     BP 04/19/24 1509 126/72     Systolic BP Percentile --      Diastolic BP Percentile --      Pulse Rate 04/19/24 1509 60     Resp 04/19/24 1509 17     Temp 04/19/24 1509 98.2 F (36.8 C)     Temp Source 04/19/24 1509 Oral     SpO2 04/19/24 1509 98 %     Weight --      Height --      Head Circumference --      Peak Flow --      Pain Score 04/19/24 1512 2     Pain Loc --      Pain Education --      Exclude from Growth Chart --    No data found.  Updated Vital Signs BP  126/72 (BP Location: Right Arm)   Pulse 60   Temp 98.2 F (36.8 C) (Oral)   Resp 17   LMP 03/28/2024 (Approximate)   SpO2 98%   Breastfeeding No   Visual Acuity Right Eye Distance:   Left Eye Distance:   Bilateral Distance:    Right Eye Near:   Left Eye Near:    Bilateral Near:     Physical Exam Vitals and nursing note reviewed.  Constitutional:      Appearance: She is not ill-appearing or toxic-appearing.  HENT:     Head: Normocephalic and atraumatic.     Right Ear: Hearing and external ear normal.     Left Ear: Hearing and external ear normal.     Nose: Nose normal.     Mouth/Throat:     Lips: Pink.  Eyes:     General: Lids are normal. Vision grossly intact. Gaze aligned appropriately.     Extraocular Movements: Extraocular movements intact.     Conjunctiva/sclera: Conjunctivae normal.  Pulmonary:     Effort: Pulmonary effort is normal.  Musculoskeletal:     Right hand: Normal.     Left hand: Tenderness and bony tenderness present. No swelling, deformity or lacerations. Decreased range of motion. Normal strength. Normal sensation. There is no disruption of two-point discrimination. Normal capillary refill. Normal pulse.     Cervical back: Neck supple.     Comments: Decreased ROM of the left thumb at the DIP joint secondary to pain. Tenderness to palpation over the left thumb DIP. Sensation and strength intact distally to left thumb. Cap refill less than 3. +2 left radial pulse.  Skin:    General: Skin is warm and dry.     Capillary Refill: Capillary refill takes less than 2 seconds.     Findings: No rash.  Neurological:     General: No focal deficit present.     Mental Status: She is alert and oriented to person, place, and time. Mental status is at baseline.     Cranial Nerves: No dysarthria or facial asymmetry.  Psychiatric:        Mood and Affect: Mood  normal.        Speech: Speech normal.        Behavior: Behavior normal.        Thought Content: Thought  content normal.        Judgment: Judgment normal.      Nunez Treatments / Results  Labs (all labs ordered are listed, but only abnormal results are displayed) Labs Reviewed - No data to display  EKG   Radiology No results found.  Procedures Procedures (including critical care time)  Medications Ordered in Nunez Medications - No data to display  Initial Impression / Assessment and Plan / Nunez Course  I have reviewed the triage vital signs and the nursing notes.  Pertinent labs & imaging results that were available during my care of the patient were reviewed by me and considered in my medical decision making (see chart for details).   1.  Closed nondisplaced fracture of the distal phalanx of the left thumb initial encounter Left thumb x-ray shows small nondisplaced avulsion fracture of the distal phalanx of the left thumb. Finger splint applied for her to wear while she is at work to protect the finger from further injury. May use Tylenol  1000 mg every 6 hours as needed for pain. History of gastric sleeve, therefore she is not a candidate for ibuprofen /NSAID use. No red flags on exam. Recommend follow-up with hand specialist as needed should symptoms fail to improve in the next 5 to 7 days with supportive care.  Counseled patient on potential for adverse effects with medications prescribed/recommended today, strict ER and return-to-clinic precautions discussed, patient verbalized understanding.    Final Clinical Impressions(s) / Nunez Diagnoses   Final diagnoses:  Closed nondisplaced fracture of distal phalanx of left thumb, initial encounter   Discharge Instructions      You have fractured the tip of your finger. You may take ibuprofen  600mg  every 6 hours as needed for aches and pains. Follow-up with [facility] for ongoing evaluation as needed should your pain worsen.   If you develop any new or worsening symptoms or if your symptoms do not start to improve, please return here  or follow-up with your primary care provider. If your symptoms are severe, please go to the emergency room.  ED Prescriptions   None    PDMP not reviewed this encounter.   Starlene Eaton, [place] 04/19/24 1549

## 2024-04-19 NOTE — ED Triage Notes (Addendum)
 Pt c/o left thumb pain after she hit hand on stair rail on mother day 1 week ago

## 2024-04-28 ENCOUNTER — Other Ambulatory Visit (INDEPENDENT_AMBULATORY_CARE_PROVIDER_SITE_OTHER): Payer: Self-pay

## 2024-04-28 ENCOUNTER — Ambulatory Visit: Admitting: Orthopedic Surgery

## 2024-04-28 DIAGNOSIS — M79645 Pain in left finger(s): Secondary | ICD-10-CM

## 2024-04-28 NOTE — Therapy (Signed)
 OUTPATIENT OCCUPATIONAL THERAPY ORTHO EVALUATION  Patient Name: Autumn Nunez MRN: [id number] DOB:Mar 05, 1996, 28 y.o., female Today's Date: 04/29/2024  PCP: [facility] REFERRING PROVIDER: Merrill Abide, MD  END OF SESSION:  OT End of Session - 04/29/24 0941     Visit Number 1    Number of Visits 9    Date for OT Re-Evaluation 06/28/24    Authorization Type [employer] Medicaid:  27 visit limit, adult=no auth needed    OT Start Time 0851    OT Stop Time 0931    OT Time Calculation (min) 40 min    Activity Tolerance Patient tolerated treatment well    Behavior During Therapy WFL for tasks assessed/performed             Past Medical History:  Diagnosis Date   Allergy    Anemia    Anxiety    Asthma    Chlamydia    Depression    Herpes    Recurrent UTI    Sleep apnea    Vitamin D deficiency    Past Surgical History:  Procedure Laterality Date   BIRTH CONTROL IMPLANT  01/29/2013   CESAREAN SECTION N/A 08/30/2023   Procedure: CESAREAN SECTION;  Surgeon: Marylu Soda, MD;  Location: [facility];  Service: Obstetrics;  Laterality: N/A;  Need Fellow MD to assist.   CYSTO WITH HYDRODISTENSION N/A 04/08/2013   Procedure: CYSTOSCOPY/HYDRODISTENSION;  Surgeon: Edmund Gouge, MD;  Location: [facility];  Service: Urology;  Laterality: N/A;   CYSTOGRAM N/A 04/08/2013   Procedure: CYSTOGRAM;  Surgeon: Edmund Gouge, MD;  Location: [facility];  Service: Urology;  Laterality: N/A;   LAPAROSCOPIC GASTRIC SLEEVE RESECTION     TONSILLECTOMY AND ADENOIDECTOMY  age 14   Patient Active Problem List   Diagnosis Date Noted   Delivery by elective cesarean section 08/30/2023   Pregnancy 08/30/2023   S/P C-section 08/30/2023   History of gastric bypass 07/31/2023   Carrier of spinal muscular atrophy 04/22/2023   Obesity, morbid, BMI 40.0-49.9 (HCC) 11/20/2013    ONSET DATE: 04/10/24  REFERRING DIAG: N82.956  (ICD-10-CM) - Pain of left thumb  THERAPY DIAG:  Pain in joint of left hand  Muscle weakness (generalized)  Stiffness of left hand, not elsewhere classified  Rationale for Evaluation and Treatment: Rehabilitation  SUBJECTIVE:   SUBJECTIVE STATEMENT: Pt reports pain improved since initial injury, but thumb is stiff  Pt accompanied by: self  PERTINENT HISTORY:   Initially pt seen by Urgent Care and reports injuring finger approx 3 weeks ago now, and was placed in thumb splint.  Per Ortho Note 04/28/24:  There is no notable fracture seen on the imaging from the urgent care setting, this is consistent with the repeat x-rays obtained today of the left thumb. She has no significant tenderness distally, her discomfort is more proximally at the thumb MP and CMC regions, likely from some prolonged immobilization. I recommended that she discontinue the splint at this time and begin active range of motion of the left thumb. She is having some difficulty with thumb opposition currently secondary to immobilization. For this reason, I would like her to be seen by occupational therapy in the near future with transition to a home exercise program in order to better achieve improvement from a range of motion standpoint with the left thumb.   PMH:   hx of gastric bypass  PRECAUTIONS: None  WEIGHT BEARING RESTRICTIONS: No  PAIN:  Are  you having pain? Yes: NPRS scale: 0-4/10, none currently Pain location: L thumb, mostly proximal Pain description: sore/achy Aggravating factors: movement, bumping it Relieving factors: keeping it stills   FALLS: Has patient fallen in last 6 months? 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  HAND DOMINANCE: Right  ADLs: Overall ADLs: mod I, Pt reports difficulty/pain with opening objects, trying to use dominant RUE primarily for tasks, not lifting with L hand.  FUNCTIONAL OUTCOME MEASURES: Quick Dash: 20%  UPPER EXTREMITY ROM:   L thumb, palmar abduction 80*, radial abduction 100*, adduction and extension WNL, pain/difficulty with opposition to 3rd digit, unable to oppose to 4-5th digits or base of 5th digit, MP flexion 50*, IP flex 40*  UPPER EXTREMITY MMT:   n/a  HAND FUNCTION: Grip strength: Right: 30 lbs; Left: unable due to pain 8/10  lbs, Lateral pinch: Right: 16 lbs, Left: NOT tested due to pain lbs, 3 point pinch: Right: 10 lbs, Left: NOT tested due to pain  lbs, and Tip pinch: Right 11 lbs, Left: NOT tested due to pain  lbs  COORDINATION: Able to pick up pencil and coin with L hand with min incr time and incr time for manipulating coins in hand  SENSATION: Denies sensation changes, numbness or tingling.  EDEMA: none noted  COGNITION: Overall cognitive status: Within functional limits for tasks assessed  OBSERVATIONS: Pleasant, cooperative.   TREATMENT DATE:   04/29/24:  Evaluation completed today.  Paraffin x to L hand with no adverse reactions for pain/stiffness.  Pt also instructed in initial HEP.                                                                                                                                  PATIENT EDUCATION: Education details: Initial HEP for AROM--see pt instructions, Avoid heavy lifting/gripping per pain (no splint) but use hand for lighter tasks.  Eval results/POC. Person educated: Patient Education method: Explanation, Demonstration, Verbal cues, and Handouts Education comprehension: verbalized understanding, returned demonstration, and verbal cues required  HOME EXERCISE PROGRAM: 04/29/24:  HEP for  AROM  GOALS: Goals reviewed with patient? Yes  SHORT TERM GOALS: Target date: 05/30/24  Pt will be independent with initial HEP for ROM. Baseline: no HEP Goal status: INITIAL  2.  Pt will demo at least 70* L thumb MP flex for gripping objects. Baseline: 50* Goal status: INITIAL  3.  Pt will demo at least 65* L thumb IP flex for gripping objects. Baseline: 40* Goal status: INITIAL  4.  Pt will be able to oppose to 5th digit for incr  ease with in-hand manipulation. Baseline:  only able to oppose to digits 2-3 and pain Goal status: INITIAL   LONG TERM GOALS: Target date: 06/29/24  Pt will be independent with strengthening HEP. Baseline: no HEP Goal status: INITIAL  2.  Pt will be able to perform thumb flex to touch base of 5th digit for incr ease with in-hand manipulation. Baseline: only able to oppose to digits 2-3 and pain Goal status: INITIAL  3.  Pt will improve L hand functional use and pain as for ADLs/IADLs as shown by improving Quick Dash score to 10% or less Baseline:  20% Goal status: INITIAL  4.  Pt will demo at least 20lbs L grip strength for opening containers/lifting objects. Baseline: unable due to pain (8/10 with attempt) Goal status: INITIAL   ASSESSMENT:  CLINICAL IMPRESSION: Patient is a 28 y.o. female who was seen today for occupational therapy evaluation for L thumb pain.  Pt injured L thumb by hitting it on steering wheel 04/10/24.  Pt was seen 5/20 by urgent care and placed in thumb immobilization splint.  Pt was seen 04/28/24 by ortho MD and Per Ortho Note,  there is no notable fracture seen on the imaging initially and with the repeat x-rays. MD recommended that she discontinue the splint at this time and begin active range of motion of the left thumb. Pt also with PMH that includes hx of gastric bypass.  Pt presents today with decr thumb ROM, weakness, and pain affecting L hand functional use and would benefit from occupational therapy to address these  issues and improve L hand functional use.  PERFORMANCE DEFICITS: in functional skills including ADLs, IADLs, coordination, dexterity, ROM, strength, pain, Fine motor control, and UE functional use.   IMPAIRMENTS: are limiting patient from ADLs, IADLs, work, and leisure.   COMORBIDITIES: has no other co-morbidities that affects occupational performance. Patient will benefit from skilled OT to address above impairments and improve overall function.  MODIFICATION OR ASSISTANCE TO COMPLETE EVALUATION: No modification of tasks or assist necessary to complete an evaluation.  OT OCCUPATIONAL PROFILE AND HISTORY: Detailed assessment: Review of records and additional review of physical, cognitive, psychosocial history related to current functional performance.  CLINICAL DECISION MAKING: LOW - limited treatment options, no task modification necessary  REHAB POTENTIAL: Good  EVALUATION COMPLEXITY: Low      PLAN:  OT FREQUENCY: 1x/week  OT DURATION: 8 weeks +eval  PLANNED INTERVENTIONS: 97535 self care/ADL training, 54098 therapeutic exercise, 97530 therapeutic activity, 97112 neuromuscular re-education, 97140 manual therapy, 97035 ultrasound, 97018 paraffin, 11914 moist heat, 97010 cryotherapy, and 97034 contrast bath  RECOMMENDED OTHER SERVICES: none at this time  CONSULTED AND AGREED WITH PLAN OF CARE: Patient  PLAN FOR NEXT SESSION: Paraffin, review AROM, add gentle PROM, ?contrast bath vs. Use of heat/ice at home prn   [facility], OTR/L 04/29/2024, 9:51 AM

## 2024-04-28 NOTE — Progress Notes (Signed)
 Autumn Nunez - 28 y.o. female MRN [id number]  Date of birth: 07/31/1996  Office Visit Note: Visit Date: 04/28/2024 PCP: Medicine, [facility] Referred by: Medicine, [facility]*  Subjective: No chief complaint on file.  HPI: Autumn Nunez is a pleasant 28 y.o. female who presents today for evaluation of a left thumb injury sustained approximately 3 weeks prior.  Injury mechanism described as striking the left thumb on a handrail.  She was seen recently in the urgent care setting, underwent clinical and radiographic workup of the left hand.  There is no significant fracture notable on the x-rays obtained.  At that time, she was placed into a splint for the left thumb with mobilization of both the IP and MP regions.  Has some ongoing soreness and stiffness near the basilar aspect of the thumb currently, no significant tenderness distally.  Pertinent ROS were reviewed with the patient and found to be negative unless otherwise specified above in HPI.   Visit Reason: left thumb injury Duration of symptoms: 04/10/24 Hand dominance: right Occupation: Naval architect Diabetic: no Smoking: No Heart/Lung History: none Blood Thinners:  none  Prior Testing/EMG: 04/19/24 Injections (Date): none Treatments: splint Prior Surgery: none  Assessment & Plan: Visit Diagnoses:  1. Pain of left thumb     Plan: Extensive discussion was had with patient today regarding her left thumb injury.  I reviewed the results of the encounter from the urgent care setting as well as the imaging obtained.  There is no notable fracture seen on the imaging from the urgent care setting, this is consistent with the repeat x-rays obtained today of the left thumb.  She has no significant tenderness distally, her discomfort is more proximally at the thumb MP and CMC regions, likely from some prolonged immobilization.  I recommended that she discontinue the splint at this time and begin  active range of motion of the left thumb.  She is having some difficulty with thumb opposition currently secondary to immobilization.  For this reason, I would like her to be seen by occupational therapy in the near future with transition to a home exercise program in order to better achieve improvement from a range of motion standpoint with the left thumb.  She is welcome to return to me as needed moving forward.  In the meantime, she can continue utilizing anti-inflammatory medication as needed.  She expressed full understanding.  Follow-up: No follow-ups on file.   Meds & Orders: No orders of the defined types were placed in this encounter.   Orders Placed This Encounter  Procedures   XR Finger Thumb Left   Ambulatory referral to Occupational Therapy     Procedures: No procedures performed      Clinical History: No specialty comments available.  She reports that she has never smoked. She has never used smokeless tobacco. No results for input(s): "HGBA1C", "LABURIC" in the last 8760 hours.  Objective:   Vital Signs: LMP 03/28/2024 (Approximate)   Physical Exam  Gen: Well-appearing, in no acute distress; non-toxic CV: Regular Rate. Well-perfused. Warm.  Resp: Breathing unlabored on room air; no wheezing. Psych: Fluid speech in conversation; appropriate affect; normal thought process  Ortho Exam Left thumb: - No significant distal tenderness, nail plate is well secured beneath the eponychial fold, sensation intact distally, normal color and capillary refill - IP range of motion 0-45, slight restriction secondary to immobilization, improved passively - Soreness elicited with deep palpation at the MP region without  instability - No significant swelling appreciated throughout the hand or thumb region  Imaging: XR Finger Thumb Left Result Date: 04/28/2024 There is no evidence of fracture or dislocation. There is no evidence of arthropathy or other focal bone abnormality. Soft  tissues are unremarkable.    Past Medical/Family/Surgical/Social History: Medications & Allergies reviewed per EMR, new medications updated. Patient Active Problem List   Diagnosis Date Noted   Delivery by elective cesarean section 08/30/2023   Pregnancy 08/30/2023   S/P C-section 08/30/2023   History of gastric bypass 07/31/2023   Carrier of spinal muscular atrophy 04/22/2023   Obesity, morbid, BMI 40.0-49.9 (HCC) 11/20/2013   Past Medical History:  Diagnosis Date   Allergy    Anemia    Anxiety    Asthma    Chlamydia    Depression    Herpes    Recurrent UTI    Sleep apnea    Vitamin D deficiency    No family history on file. Past Surgical History:  Procedure Laterality Date   BIRTH CONTROL IMPLANT  01/29/2013   CESAREAN SECTION N/A 08/30/2023   Procedure: CESAREAN SECTION;  Surgeon: Autumn Soda, MD;  Location: [facility];  Service: Obstetrics;  Laterality: N/A;  Need Fellow MD to assist.   CYSTO WITH HYDRODISTENSION N/A 04/08/2013   Procedure: CYSTOSCOPY/HYDRODISTENSION;  Surgeon: Autumn Gouge, MD;  Location: [facility];  Service: Urology;  Laterality: N/A;   CYSTOGRAM N/A 04/08/2013   Procedure: CYSTOGRAM;  Surgeon: Autumn Gouge, MD;  Location: [facility];  Service: Urology;  Laterality: N/A;   LAPAROSCOPIC GASTRIC SLEEVE RESECTION     TONSILLECTOMY AND ADENOIDECTOMY  age 37   Social History   Occupational History   Not on file  Tobacco Use   Smoking status: Never   Smokeless tobacco: Never  Vaping Use   Vaping status: Never Used  Substance and Sexual Activity   Alcohol use: No   Drug use: No   Sexual activity: Not on file    Autumn Nunez Autumn Nunez) Autumn Nunez, M.D. [facility], Hand Surgery

## 2024-04-29 ENCOUNTER — Ambulatory Visit: Attending: Orthopedic Surgery | Admitting: Occupational Therapy

## 2024-04-29 ENCOUNTER — Encounter: Payer: Self-pay | Admitting: Occupational Therapy

## 2024-04-29 DIAGNOSIS — M6281 Muscle weakness (generalized): Secondary | ICD-10-CM | POA: Insufficient documentation

## 2024-04-29 DIAGNOSIS — M25542 Pain in joints of left hand: Secondary | ICD-10-CM | POA: Insufficient documentation

## 2024-04-29 DIAGNOSIS — M25642 Stiffness of left hand, not elsewhere classified: Secondary | ICD-10-CM | POA: Diagnosis present

## 2024-04-29 DIAGNOSIS — M79645 Pain in left finger(s): Secondary | ICD-10-CM | POA: Insufficient documentation

## 2024-04-29 NOTE — Patient Instructions (Signed)
 Opposition (Active)   Touch tip of thumb to nail tip of each finger in turn, making an "O" shape. Repeat __10__ times. Do _3-4___ sessions per day.    MP Flexion (Active)   Bend thumb to touch base of little finger, keeping tip joint straight. Repeat __10-15__ times. Do _3-4___ sessions per day    IP Flexion (Active Blocked)   Brace thumb below tip joint. Bend joint as far as possible. Repeat __10-15__ times. Do _3-4___ sessions per day.    MP Flexion (Active Blocked)    Using other hand to brace base of thumb, bend as far as possible with tip joint held straight. Repeat 10-15 times. Do 3-4 sessions per day.

## 2024-05-03 ENCOUNTER — Ambulatory Visit: Attending: Orthopedic Surgery | Admitting: Occupational Therapy

## 2024-05-03 ENCOUNTER — Encounter: Payer: Self-pay | Admitting: Occupational Therapy

## 2024-05-03 DIAGNOSIS — M25542 Pain in joints of left hand: Secondary | ICD-10-CM | POA: Diagnosis present

## 2024-05-03 DIAGNOSIS — M6281 Muscle weakness (generalized): Secondary | ICD-10-CM | POA: Diagnosis present

## 2024-05-03 DIAGNOSIS — M25642 Stiffness of left hand, not elsewhere classified: Secondary | ICD-10-CM | POA: Diagnosis present

## 2024-05-03 NOTE — Patient Instructions (Addendum)
 Use heat prior to exercises for 5-10 mins wrapped in a towel, use ice after exercises for 5-8 mins wrapped in a towel.   Composite Flexion (Passive)   Use other hand to bend both joints of thumb at the same time. Hold _10___ seconds. Repeat __5-10 times. Do _2___ sessions per day.   MP Flexion (Passive)    Use other hand to bend base joint of thumb. Hold ___5_ seconds. Repeat _10___ times. Do __2__ sessions per day. Activity: Tuck thumb under all fingers.*  Copyright  VHI. All rights reserved.

## 2024-05-03 NOTE — Therapy (Signed)
 OUTPATIENT OCCUPATIONAL THERAPY ORTHO Treatment  Patient Name: Autumn Nunez MRN: [id number] DOB:1996/03/09, 28 y.o., female Today's Date: 05/03/2024  PCP: [facility] REFERRING PROVIDER: Merrill Abide, MD  END OF SESSION:  OT End of Session - 05/03/24 1536     Visit Number 2    Number of Visits 9    Date for OT Re-Evaluation 06/28/24    Authorization Type [employer] Medicaid:  27 visit limit, adult=no auth needed    OT Start Time 1533    OT Stop Time 1617    OT Time Calculation (min) 44 min              Past Medical History:  Diagnosis Date   Allergy    Anemia    Anxiety    Asthma    Chlamydia    Depression    Herpes    Recurrent UTI    Sleep apnea    Vitamin D deficiency    Past Surgical History:  Procedure Laterality Date   BIRTH CONTROL IMPLANT  01/29/2013   CESAREAN SECTION N/A 08/30/2023   Procedure: CESAREAN SECTION;  Surgeon: Marylu Soda, MD;  Location: [facility];  Service: Obstetrics;  Laterality: N/A;  Need Fellow MD to assist.   CYSTO WITH HYDRODISTENSION N/A 04/08/2013   Procedure: CYSTOSCOPY/HYDRODISTENSION;  Surgeon: Edmund Gouge, MD;  Location: [facility]  Service: Urology;  Laterality: N/A;   CYSTOGRAM N/A 04/08/2013   Procedure: CYSTOGRAM;  Surgeon: Edmund Gouge, MD;  Location: [facility];  Service: Urology;  Laterality: N/A;   LAPAROSCOPIC GASTRIC SLEEVE RESECTION     TONSILLECTOMY AND ADENOIDECTOMY  age 44   Patient Active Problem List   Diagnosis Date Noted   Delivery by elective cesarean section 08/30/2023   Pregnancy 08/30/2023   S/P C-section 08/30/2023   History of gastric bypass 07/31/2023   Carrier of spinal muscular atrophy 04/22/2023   Obesity, morbid, BMI 40.0-49.9 (HCC) 11/20/2013    ONSET DATE: 04/10/24  REFERRING DIAG: W09.811 (ICD-10-CM) - Pain of left thumb  THERAPY DIAG:  Pain in joint of left hand  Muscle weakness  (generalized)  Stiffness of left hand, not elsewhere classified  Rationale for Evaluation and Treatment: Rehabilitation  SUBJECTIVE:   SUBJECTIVE STATEMENT: Pt reports no pain on arrival, just stiffness  Pt accompanied by: self  PERTINENT HISTORY:   Initially pt seen by Urgent Care and reports injuring finger approx 3 weeks ago now, and was placed in thumb splint.  Per Ortho Note 04/28/24:  There is no notable fracture seen on the imaging from the urgent care setting, this is consistent with the repeat x-rays obtained today of the left thumb. She has no significant tenderness distally, her discomfort is more proximally at the thumb MP and CMC regions, likely from some prolonged immobilization. I recommended that she discontinue the splint at this time and begin active range of motion of the left thumb. She is having some difficulty with thumb opposition currently secondary to immobilization. For this reason, I would like her to be seen by occupational therapy in the near future with transition to a home exercise program in order to better achieve improvement from a range of motion standpoint with the left thumb.   PMH:   hx of gastric bypass  PRECAUTIONS: None  WEIGHT BEARING RESTRICTIONS: No  PAIN:  Are you having pain? Yes: NPRS scale: 0-3/10, none currently Pain location: L thumb, mostly proximal Pain description: sore/achy Aggravating factors: movement,  bumping it Relieving factors: keeping it stills   FALLS: Has patient fallen in last 6 months? 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  HAND DOMINANCE:  Right  ADLs: Overall ADLs: mod I, Pt reports difficulty/pain with opening objects, trying to use dominant RUE primarily for tasks, not lifting with L hand.  FUNCTIONAL OUTCOME MEASURES: Quick Dash: 20%  UPPER EXTREMITY ROM:   L thumb, palmar abduction 80*, radial abduction 100*, adduction and extension WNL, pain/difficulty with opposition to 3rd digit, unable to oppose to 4-5th digits or base of 5th digit, MP flexion 50*, IP flex 40*  UPPER EXTREMITY MMT:   n/a  HAND FUNCTION: Grip strength: Right: 30 lbs; Left: unable due to pain 8/10  lbs, Lateral pinch: Right: 16 lbs, Left: NOT tested due to pain lbs, 3 point pinch: Right: 10 lbs, Left: NOT tested due to pain  lbs, and Tip pinch: Right 11 lbs, Left: NOT tested due to pain  lbs  COORDINATION: Able to pick up pencil and coin with L hand with min incr time and incr time for manipulating coins in hand  SENSATION: Denies sensation changes, numbness or tingling.  EDEMA: none noted  COGNITION: Overall cognitive status: Within functional limits for tasks assessed  OBSERVATIONS: Pleasant, cooperative.   TREATMENT DATE: 6/32/5Paraffin x to L hand with no adverse reactions for pain/stiffness. Reviewed previously issued HEP for A/ROM exercises 5-10 reps each,  US  , 0.8 w/cm2, 20% to left thumb and thenar eminence, no adverse reactions. Pt was instructed in beginning P/ROM, 5 reps each, min v.c Ice pack x 5 mins to left hand and thumb, no adverse reactions.  04/29/24:  Evaluation completed today.  Paraffin x to L hand with no adverse reactions for pain/stiffness.  Pt also instructed in initial HEP.                                                                                                                                  PATIENT EDUCATION: Education details:  Reviewed Initial HEP for AROM, gentle P/ROM exercises added , use of heat and ice at home Person educated: Patient Education method: Explanation, Demonstration,  Verbal cues, and Handouts Education comprehension: verbalized understanding, returned demonstration, and verbal cues required  HOME EXERCISE PROGRAM: 04/29/24:  HEP for AROM  GOALS: Goals reviewed with patient? Yes  SHORT TERM GOALS: Target date: 05/30/24  Pt will be independent with initial HEP for ROM. Baseline: no HEP Goal status: ongoing 05/03/24  2.  Pt will demo at least 70* L thumb MP flex for gripping objects. Baseline: 50* Goal status:  ongoing 55*  05/03/24  3.  Pt will demo at least 65* L thumb IP flex for gripping objects. Baseline: 40* Goal status:  ongoing 45*05/03/24  4.  Pt will be able to oppose to 5th digit for incr ease with in-hand manipulation. Baseline:  only able to oppose to digits 2-3 and pain Goal status: ongoing 05/03/24   LONG TERM GOALS: Target date: 06/29/24  Pt will be independent with strengthening HEP. Baseline: no HEP Goal status: INITIAL  2.  Pt will be able to perform thumb flex to touch base of 5th digit for incr ease with in-hand manipulation. Baseline: only able to oppose to digits 2-3 and pain Goal status: INITIAL  3.  Pt will improve L hand functional use and pain as for ADLs/IADLs as shown by improving Quick Dash score to 10% or less Baseline:  20% Goal status: INITIAL  4.  Pt will demo at least 20lbs L grip strength for opening containers/lifting objects. Baseline: unable due to pain (8/10 with attempt) Goal status: INITIAL   ASSESSMENT:  CLINICAL IMPRESSION:Pt is progressing towards goals. She demonstrates improving A/ROM and decreasing pain. PERFORMANCE DEFICITS: in functional skills including ADLs, IADLs, coordination, dexterity, ROM, strength, pain, Fine motor control, and UE functional use.   IMPAIRMENTS: are limiting patient from ADLs, IADLs, work, and leisure.   COMORBIDITIES: has no other co-morbidities that affects occupational performance. Patient will benefit from skilled OT to address above impairments and improve  overall function.  MODIFICATION OR ASSISTANCE TO COMPLETE EVALUATION: No modification of tasks or assist necessary to complete an evaluation.  OT OCCUPATIONAL PROFILE AND HISTORY: Detailed assessment: Review of records and additional review of physical, cognitive, psychosocial history related to current functional performance.  CLINICAL DECISION MAKING: LOW - limited treatment options, no task modification necessary  REHAB POTENTIAL: Good  EVALUATION COMPLEXITY: Low      PLAN:  OT FREQUENCY: 1x/week  OT DURATION: 8 weeks +eval  PLANNED INTERVENTIONS: 97535 self care/ADL training, 16109 therapeutic exercise, 97530 therapeutic activity, 97112 neuromuscular re-education, 97140 manual therapy, 97035 ultrasound, 97018 paraffin, 60454 moist heat, 97010 cryotherapy, and 97034 contrast bath  RECOMMENDED OTHER SERVICES: none at this time  CONSULTED AND AGREED WITH PLAN OF CARE: Patient  PLAN FOR NEXT SESSION: Paraffin, review AROM, review gentle PROM,  Akaysha Cobern, OTR/L 05/03/2024, 4:21 PM

## 2024-05-17 ENCOUNTER — Encounter: Payer: Self-pay | Admitting: Occupational Therapy

## 2024-05-17 ENCOUNTER — Ambulatory Visit: Admitting: Occupational Therapy

## 2024-05-17 DIAGNOSIS — M6281 Muscle weakness (generalized): Secondary | ICD-10-CM

## 2024-05-17 DIAGNOSIS — M25642 Stiffness of left hand, not elsewhere classified: Secondary | ICD-10-CM

## 2024-05-17 DIAGNOSIS — M25542 Pain in joints of left hand: Secondary | ICD-10-CM | POA: Diagnosis not present

## 2024-05-17 NOTE — Patient Instructions (Signed)
 1. Grip Strengthening (Resistive Putty)   Squeeze putty using thumb and all fingers. Repeat _10___ times. Do __1__ sessions per day.   2. Roll putty into tube on table and pinch between first each fingers and thumb x 10 reps. Do 1 sessions per day     Copyright  VHI. All rights reserved.

## 2024-05-17 NOTE — Therapy (Signed)
 OUTPATIENT OCCUPATIONAL THERAPY ORTHO Treatment  Patient Name: Autumn Nunez MRN: [id number] DOB:May 31, 1996, 28 y.o., female Today's Date: 05/17/2024  [facility]P: [facility] REFERRING PROVIDER: Merrill Abide, MD  END OF SESSION:  OT End of Session - 05/17/24 1535     Visit Number 3    Number of Visits 9    Authorization Type [employer] Medicaid:  27 visit limit, adult=no auth needed    OT Start Time 1534    OT Stop Time 1614    OT Time Calculation (min) 40 min    Activity Tolerance Patient tolerated treatment well    Behavior During Therapy WFL for tasks assessed/performed           Past Medical History:  Diagnosis Date   Allergy    Anemia    Anxiety    Asthma    Chlamydia    Depression    Herpes    Recurrent UTI    Sleep apnea    Vitamin D deficiency    Past Surgical History:  Procedure Laterality Date   BIRTH CONTROL IMPLANT  01/29/2013   CESAREAN SECTION N/A 08/30/2023   Procedure: CESAREAN SECTION;  Surgeon: Marylu Soda, MD;  Location: [facility];  Service: Obstetrics;  Laterality: N/A;  Need Fellow MD to assist.   CYSTO WITH HYDRODISTENSION N/A 04/08/2013   Procedure: CYSTOSCOPY/HYDRODISTENSION;  Surgeon: Edmund Gouge, MD;  Location: [facility];  Service: Urology;  Laterality: N/A;   CYSTOGRAM N/A 04/08/2013   Procedure: CYSTOGRAM;  Surgeon: Edmund Gouge, MD;  Location: [facility];  Service: Urology;  Laterality: N/A;   LAPAROSCOPIC GASTRIC SLEEVE RESECTION     TONSILLECTOMY AND ADENOIDECTOMY  age 28   Patient Active Problem List   Diagnosis Date Noted   Delivery by elective cesarean section 08/30/2023   Pregnancy 08/30/2023   S/P C-section 08/30/2023   History of gastric bypass 07/31/2023   Carrier of spinal muscular atrophy 04/22/2023   Obesity, morbid, BMI 40.0-49.9 (HCC) 11/20/2013    ONSET DATE: 04/10/24  REFERRING DIAG: B14.782 (ICD-10-CM) - Pain of left thumb  THERAPY  DIAG:  No diagnosis found.  Rationale for Evaluation and Treatment: Rehabilitation  SUBJECTIVE:   SUBJECTIVE STATEMENT: Pt reports no pain on arrival, just stiffness  Pt accompanied by: self  PERTINENT HISTORY:   Initially pt seen by Urgent Care and reports injuring finger approx 3 weeks ago now, and was placed in thumb splint.  Per Ortho Note 04/28/24:  There is no notable fracture seen on the imaging from the urgent care setting, this is consistent with the repeat x-rays obtained today of the left thumb. She has no significant tenderness distally, her discomfort is more proximally at the thumb MP and CMC regions, likely from some prolonged immobilization. I recommended that she discontinue the splint at this time and begin active range of motion of the left thumb. She is having some difficulty with thumb opposition currently secondary to immobilization. For this reason, I would like her to be seen by occupational therapy in the near future with transition to a home exercise program in order to better achieve improvement from a range of motion standpoint with the left thumb.   PMH:   hx of gastric bypass  PRECAUTIONS: None  WEIGHT BEARING RESTRICTIONS: No  PAIN:  Are you having pain? Yes: NPRS scale: 0-1/10,  Pain location: L thumb, mostly proximal Pain description: sore/achy Aggravating factors: movement, bumping it Relieving factors: keeping it stills  FALLS: Has patient fallen in last 6 months? 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  HAND DOMINANCE: Right  ADLs: Overall ADLs: mod I, Pt reports  difficulty/pain with opening objects, trying to use dominant RUE primarily for tasks, not lifting with L hand.  FUNCTIONAL OUTCOME MEASURES: Quick Dash: 20%  UPPER EXTREMITY ROM:   L thumb, palmar abduction 80*, radial abduction 100*, adduction and extension WNL, pain/difficulty with opposition to 3rd digit, unable to oppose to 4-5th digits or base of 5th digit, MP flexion 50*, IP flex 40*  UPPER EXTREMITY MMT:   n/a  HAND FUNCTION: Grip strength: Right: 30 lbs; Left: unable due to pain 8/10  lbs, Lateral pinch: Right: 16 lbs, Left: NOT tested due to pain lbs, 3 point pinch: Right: 10 lbs, Left: NOT tested due to pain  lbs, and Tip pinch: Right 11 lbs, Left: NOT tested due to pain  lbs  COORDINATION: Able to pick up pencil and coin with L hand with min incr time and incr time for manipulating coins in hand  SENSATION: Denies sensation changes, numbness or tingling.  EDEMA: none noted  COGNITION: Overall cognitive status: Within functional limits for tasks assessed  OBSERVATIONS: Pleasant, cooperative.   TREATMENT DATE: 05/17/24-US  , 0.8 w/cm2, 20% to left thumb and thenar eminence, no adverse reactions. A/ROM thumb flexion, IP flexion then composite thumb flexion with place and hold, min v.c and facilitation Finger thumb opposition to all digits. Pt was instructed in red putty HEP for composite grip then tip pinch, min v.c and demonstration.Pt also performed lateral pinch, x 10 reps however this was not issued for home. thumb MP flexion 55*, IP flexion 50 today. Gentle soft tissue and joint mobs to left thumb. Ice probe to left volar thumb and thenar eminence x 4 mins for pain and stiffness. Pt was instructed that she can use and ice pack at home for 5-8 mins. 6/3/25Paraffin x to L hand with no adverse reactions for pain/stiffness. Reviewed previously issued HEP for A/ROM exercises 5-10 reps each,  US  , 0.8 w/cm2, 20% to left thumb and thenar eminence, no adverse  reactions. Pt was instructed in beginning P/ROM, 5 reps each, min v.c Ice pack x 5 mins to left hand and thumb, no adverse reactions.  04/29/24:  Evaluation completed today.  Paraffin x to L hand with no adverse reactions for pain/stiffness.  Pt also instructed in initial HEP.  PATIENT EDUCATION: Education details:  red putty - pt was instructed to discontinue if pain increases. Person educated: Patient Education method: Explanation, Demonstration, Verbal cues, and Handouts Education comprehension: verbalized understanding, returned demonstration, and verbal cues required  HOME EXERCISE PROGRAM: 04/29/24:  HEP for AROM  GOALS: Goals reviewed with patient? Yes  SHORT TERM GOALS: Target date: 05/30/24  Pt will be independent with initial HEP for ROM. Baseline: no HEP Goal status: met, 05/17/24  2.  Pt will demo at least 70* L thumb MP flex for gripping objects. Baseline: 50* Goal status:  ongoing 55*  05/17/24  3.  Pt will demo at least 65* L thumb IP flex for gripping objects. Baseline: 40* Goal status:  ongoing 50*05/17/24  4.  Pt will be able to oppose to 5th digit for incr ease with in-hand manipulation. Baseline:  only able to oppose to digits 2-3 and pain Goal status: met 05/17/24   LONG TERM GOALS: Target date: 06/29/24  Pt will be independent with strengthening HEP. Baseline: no HEP Goal status: INITIAL  2.  Pt will be able to perform thumb flex to touch base of 5th digit for incr ease with in-hand manipulation. Baseline: only able to oppose to digits 2-3 and pain Goal status: INITIAL  3.  Pt will improve L hand functional use and pain as for ADLs/IADLs as shown by improving Quick Dash score to 10% or less Baseline:  20% Goal status: INITIAL  4.  Pt will demo at least 20lbs L grip strength for opening containers/lifting  objects. Baseline: unable due to pain (8/10 with attempt) Goal status: met 55 lbs 05/17/24   ASSESSMENT:  CLINICAL IMPRESSION:Pt is progressing towards goals. She demonstrates improving A/ROM. she is now able to opposie her 5th digit and she demonstrates 55 lbs of grip strength with LUE. PERFORMANCE DEFICITS: in functional skills including ADLs, IADLs, coordination, dexterity, ROM, strength, pain, Fine motor control, and UE functional use.   IMPAIRMENTS: are limiting patient from ADLs, IADLs, work, and leisure.   COMORBIDITIES: has no other co-morbidities that affects occupational performance. Patient will benefit from skilled OT to address above impairments and improve overall function.  MODIFICATION OR ASSISTANCE TO COMPLETE EVALUATION: No modification of tasks or assist necessary to complete an evaluation.  OT OCCUPATIONAL PROFILE AND HISTORY: Detailed assessment: Review of records and additional review of physical, cognitive, psychosocial history related to current functional performance.  CLINICAL DECISION MAKING: LOW - limited treatment options, no task modification necessary  REHAB POTENTIAL: Good  EVALUATION COMPLEXITY: Low      PLAN:  OT FREQUENCY: 1x/week  OT DURATION: 8 weeks +eval  PLANNED INTERVENTIONS: 97535 self care/ADL training, 09811 therapeutic exercise, 97530 therapeutic activity, 97112 neuromuscular re-education, 97140 manual therapy, 97035 ultrasound, 97018 paraffin, 91478 moist heat, 97010 cryotherapy, and 97034 contrast bath  RECOMMENDED OTHER SERVICES: none at this time  CONSULTED AND AGREED WITH PLAN OF CARE: Patient  PLAN FOR NEXT SESSION:  check on putty HEP, ROM Tamaria Dunleavy, OTR/L 05/17/2024, 3:35 PM

## 2024-05-25 ENCOUNTER — Ambulatory Visit: Admitting: Occupational Therapy

## 2024-05-31 NOTE — Therapy (Unsigned)
 OUTPATIENT OCCUPATIONAL THERAPY ORTHO Treatment  Patient Name: Autumn Nunez MRN: [id number] DOB:[birth year], 28 y.o., female Today's Date: 06/01/2024  PCP: [facility] REFERRING PROVIDER: Arlinda Buster, MD  END OF SESSION:  OT End of Session - 06/01/24 0829     Visit Number 4    Number of Visits 9    Date for OT Re-Evaluation 06/28/24    Authorization Type [employer]:  27 visit limit, adult=no auth needed    OT Start Time 0817    OT Stop Time 0844    OT Time Calculation (min) 27 min            Past Medical History:  Diagnosis Date   Allergy    Anemia    Anxiety    Asthma    Chlamydia    Depression    Herpes    Recurrent UTI    Sleep apnea    Vitamin D deficiency    Past Surgical History:  Procedure Laterality Date   BIRTH CONTROL IMPLANT  01/29/2013   CESAREAN SECTION N/A 08/30/2023   Procedure: CESAREAN SECTION;  Surgeon: Armond Cape, MD;  Location: [facility];  Service: Obstetrics;  Laterality: N/A;  Need Fellow MD to assist.   CYSTO WITH HYDRODISTENSION N/A 04/08/2013   Procedure: CYSTOSCOPY/HYDRODISTENSION;  Surgeon: Arlena LILLETTE Gal, MD;  Location: [facility];  Service: Urology;  Laterality: N/A;   CYSTOGRAM N/A 04/08/2013   Procedure: CYSTOGRAM;  Surgeon: Arlena LILLETTE Gal, MD;  Location: [facility];  Service: Urology;  Laterality: N/A;   LAPAROSCOPIC GASTRIC SLEEVE RESECTION     TONSILLECTOMY AND ADENOIDECTOMY  age 21   Patient Active Problem List   Diagnosis Date Noted   Delivery by elective cesarean section 08/30/2023   Pregnancy 08/30/2023   S/P C-section 08/30/2023   History of gastric bypass 07/31/2023   Carrier of spinal muscular atrophy 04/22/2023   Obesity, morbid, BMI 40.0-49.9 (HCC) 11/20/2013    ONSET DATE: 04/10/24  REFERRING DIAG: F20.354 (ICD-10-CM) - Pain of left thumb  THERAPY DIAG:  Pain in joint of left hand  Muscle weakness (generalized)  Stiffness  of left hand, not elsewhere classified  Rationale for Evaluation and Treatment: Rehabilitation  SUBJECTIVE:   SUBJECTIVE STATEMENT: Pt reports no pain on arrival, just stiffness  Pt accompanied by: self  PERTINENT HISTORY:   Initially pt seen by Urgent Care and reports injuring finger approx 3 weeks ago now, and was placed in thumb splint.  Per Ortho Note 04/28/24:  There is no notable fracture seen on the imaging from the urgent care setting, this is consistent with the repeat x-rays obtained today of the left thumb. She has no significant tenderness distally, her discomfort is more proximally at the thumb MP and CMC regions, likely from some prolonged immobilization. I recommended that she discontinue the splint at this time and begin active range of motion of the left thumb. She is having some difficulty with thumb opposition currently secondary to immobilization. For this reason, I would like her to be seen by occupational therapy in the near future with transition to a home exercise program in order to better achieve improvement from a range of motion standpoint with the left thumb.   PMH:   hx of gastric bypass  PRECAUTIONS: None  WEIGHT BEARING RESTRICTIONS: No  PAIN: no, just stiffness   FALLS: Has patient fallen in last 6 months? 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  HAND DOMINANCE: Right  ADLs: Overall ADLs: mod I, Pt reports difficulty/pain with opening objects, trying to use dominant RUE primarily for tasks, not lifting with L hand.  FUNCTIONAL OUTCOME MEASURES: Quick Dash: 20%  UPPER  EXTREMITY ROM:   L thumb, palmar abduction 80*, radial abduction 100*, adduction and extension WNL, pain/difficulty with opposition to 3rd digit, unable to oppose to 4-5th digits or base of 5th digit, MP flexion 50*, IP flex 40*  UPPER EXTREMITY MMT:   n/a  HAND FUNCTION: Grip strength: Right: 30 lbs; Left: unable due to pain 8/10  lbs, Lateral pinch: Right: 16 lbs, Left: NOT tested due to pain lbs, 3 point pinch: Right: 10 lbs, Left: NOT tested due to pain  lbs, and Tip pinch: Right 11 lbs, Left: NOT tested due to pain  lbs  COORDINATION: Able to pick up pencil and coin with L hand with min incr time and incr time for manipulating coins in hand  SENSATION: Denies sensation changes, numbness or tingling.  EDEMA: none noted  COGNITION: Overall cognitive status: Within functional limits for tasks assessed  OBSERVATIONS: Pleasant, cooperative.   TREATMENT DATE: 06/01/24-Pt arrived late US  , 0.8 w/cm2, 20% to left volar and dorsal thumb and thenar eminence, no adverse reactions. A/ROM thumb flexion, IP flexion then composite thumb flexion with place and hold and passive stretch to thumb compositely then for passive IP flexion, min v.c and facilitation Finger thumb opposition to all digits. Digi-flex light reistance for composite finger flexion x 10 reps Graded clothespins 1-8# placing with tip and 3 pt pinch then removing with lateral pinch, min difficulty.  05/17/24-US  , 0.8 w/cm2, 20% to left thumb and thenar eminence, no adverse reactions. A/ROM thumb flexion, IP flexion then composite thumb flexion with place and hold, min v.c and facilitation Finger thumb opposition to all digits. Pt was instructed in red putty HEP for composite grip then tip pinch, min v.c and demonstration.Pt also performed lateral pinch, x 10 reps however this was not issued for home. thumb MP flexion 55*, IP flexion 50 today. Gentle soft tissue and joint mobs to left thumb. Ice probe to left volar thumb  and thenar eminence x 4 mins for pain and stiffness. Pt was instructed that she can use and ice pack at home for 5-8 mins. 6/3/25Paraffin x to L hand with no adverse reactions for pain/stiffness. Reviewed previously issued HEP for A/ROM exercises 5-10 reps each,  US  , 0.8 w/cm2, 20% to left thumb and thenar eminence, no adverse reactions. Pt was instructed in beginning P/ROM, 5 reps each, min v.c Ice pack x 5 mins to left hand and thumb, no adverse reactions.  04/29/24:  Evaluation completed today.  Paraffin x to L hand with no adverse reactions for pain/stiffness.  Pt also instructed in initial HEP.  PATIENT EDUCATION: Education details:   see above. Person educated: Patient Education method: Explanation, Demonstration, Verbal cues,  Education comprehension: verbalized understanding, returned demonstration, and verbal cues required  HOME EXERCISE PROGRAM: 04/29/24:  HEP for AROM  GOALS: Goals reviewed with patient? Yes  SHORT TERM GOALS: Target date: 05/30/24  Pt will be independent with initial HEP for ROM. Baseline: no HEP Goal status: met, 05/17/24  2.  Pt will demo at least 70* L thumb MP flex for gripping objects. Baseline: 50* Goal status:  ongoing 55*  06/01/24  3.  Pt will demo at least 65* L thumb IP flex for gripping objects. Baseline: 40* Goal status:  ongoing 50*05/31/24  4.  Pt will be able to oppose to 5th digit for incr ease with in-hand manipulation. Baseline:  only able to oppose to digits 2-3 and pain Goal status: met 05/17/24   LONG TERM GOALS: Target date: 06/29/24  Pt will be independent with strengthening HEP. Baseline: no HEP Goal status: INITIAL  2.  Pt will be able to perform thumb flex to touch base of 5th digit for incr ease with in-hand manipulation. Baseline: only able to oppose to digits 2-3 and pain Goal  status: INITIAL  3.  Pt will improve L hand functional use and pain as for ADLs/IADLs as shown by improving Quick Dash score to 10% or less Baseline:  20% Goal status: INITIAL  4.  Pt will demo at least 20lbs L grip strength for opening containers/lifting objects. Baseline: unable due to pain (8/10 with attempt) Goal status: met 55 lbs 05/17/24   ASSESSMENT:  CLINICAL IMPRESSION:Pt is progressing towards goals. She demonstrates improving strength and decreased pain. Pt ROM remains limited.PERFORMANCE DEFICITS: in functional skills including ADLs, IADLs, coordination, dexterity, ROM, strength, pain, Fine motor control, and UE functional use.   IMPAIRMENTS: are limiting patient from ADLs, IADLs, work, and leisure.   COMORBIDITIES: has no other co-morbidities that affects occupational performance. Patient will benefit from skilled OT to address above impairments and improve overall function.  MODIFICATION OR ASSISTANCE TO COMPLETE EVALUATION: No modification of tasks or assist necessary to complete an evaluation.  OT OCCUPATIONAL PROFILE AND HISTORY: Detailed assessment: Review of records and additional review of physical, cognitive, psychosocial history related to current functional performance.  CLINICAL DECISION MAKING: LOW - limited treatment options, no task modification necessary  REHAB POTENTIAL: Good  EVALUATION COMPLEXITY: Low      PLAN:  OT FREQUENCY: 1x/week  OT DURATION: 8 weeks +eval  PLANNED INTERVENTIONS: 97535 self care/ADL training, 02889 therapeutic exercise, 97530 therapeutic activity, 97112 neuromuscular re-education, 97140 manual therapy, 97035 ultrasound, 97018 paraffin, 02989 moist heat, 97010 cryotherapy, and 97034 contrast bath  RECOMMENDED OTHER SERVICES: none at this time  CONSULTED AND AGREED WITH PLAN OF CARE: Patient  PLAN FOR NEXT SESSION:  check on putty HEP, ROM, work towards long term goals, consider adding more visits? Yarely Bebee,  OTR/L 06/01/2024, 8:35 AM

## 2024-06-01 ENCOUNTER — Ambulatory Visit: Attending: Orthopedic Surgery | Admitting: Occupational Therapy

## 2024-06-01 DIAGNOSIS — M6281 Muscle weakness (generalized): Secondary | ICD-10-CM | POA: Diagnosis present

## 2024-06-01 DIAGNOSIS — M25642 Stiffness of left hand, not elsewhere classified: Secondary | ICD-10-CM | POA: Insufficient documentation

## 2024-06-01 DIAGNOSIS — M25542 Pain in joints of left hand: Secondary | ICD-10-CM | POA: Diagnosis present

## 2024-06-08 NOTE — Therapy (Incomplete)
 OUTPATIENT OCCUPATIONAL THERAPY ORTHO Treatment  Patient Name: Autumn Nunez MRN: [id number] DOB:[birth year], 28 y.o., female Today's Date: 06/08/2024  PCP: [facility] REFERRING PROVIDER: Arlinda Buster, MD  END OF SESSION:      Past Medical History:  Diagnosis Date   Allergy    Anemia    Anxiety    Asthma    Chlamydia    Depression    Herpes    Recurrent UTI    Sleep apnea    Vitamin D deficiency    Past Surgical History:  Procedure Laterality Date   BIRTH CONTROL IMPLANT  01/29/2013   CESAREAN SECTION N/A 08/30/2023   Procedure: CESAREAN SECTION;  Surgeon: Armond Cape, MD;  Location: [facility];  Service: Obstetrics;  Laterality: N/A;  Need Fellow MD to assist.   CYSTO WITH HYDRODISTENSION N/A 04/08/2013   Procedure: CYSTOSCOPY/HYDRODISTENSION;  Surgeon: Arlena LILLETTE Gal, MD;  Location: [facility];  Service: Urology;  Laterality: N/A;   CYSTOGRAM N/A 04/08/2013   Procedure: CYSTOGRAM;  Surgeon: Arlena LILLETTE Gal, MD;  Location: [facility];  Service: Urology;  Laterality: N/A;   LAPAROSCOPIC GASTRIC SLEEVE RESECTION     TONSILLECTOMY AND ADENOIDECTOMY  age 37   Patient Active Problem List   Diagnosis Date Noted   Delivery by elective cesarean section 08/30/2023   Pregnancy 08/30/2023   S/P C-section 08/30/2023   History of gastric bypass 07/31/2023   Carrier of spinal muscular atrophy 04/22/2023   Obesity, morbid, BMI 40.0-49.9 (HCC) 11/20/2013    ONSET DATE: 04/10/24  REFERRING DIAG: F20.354 (ICD-10-CM) - Pain of left thumb  THERAPY DIAG:  No diagnosis found.  Rationale for Evaluation and Treatment: Rehabilitation  SUBJECTIVE:   SUBJECTIVE STATEMENT: Pt reports no pain on arrival, just stiffness  Pt accompanied by: self  PERTINENT HISTORY:   Initially pt seen by Urgent Care and reports injuring finger approx 3 weeks ago now, and was placed in thumb splint.  Per Ortho Note  04/28/24:  There is no notable fracture seen on the imaging from the urgent care setting, this is consistent with the repeat x-rays obtained today of the left thumb. She has no significant tenderness distally, her discomfort is more proximally at the thumb MP and CMC regions, likely from some prolonged immobilization. I recommended that she discontinue the splint at this time and begin active range of motion of the left thumb. She is having some difficulty with thumb opposition currently secondary to immobilization. For this reason, I would like her to be seen by occupational therapy in the near future with transition to a home exercise program in order to better achieve improvement from a range of motion standpoint with the left thumb.   PMH:   hx of gastric bypass  PRECAUTIONS: None  WEIGHT BEARING RESTRICTIONS: No  PAIN: no, just stiffness   FALLS: Has patient fallen in last 6 months? 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  HAND DOMINANCE: Right  ADLs: Overall ADLs: mod I, Pt reports difficulty/pain with opening objects, trying to use dominant RUE primarily for tasks, not lifting with L hand.  FUNCTIONAL OUTCOME MEASURES:  Quick Dash: 20%  UPPER EXTREMITY ROM:   L thumb, palmar abduction 80*, radial abduction 100*, adduction and extension WNL, pain/difficulty with opposition to 3rd digit, unable to oppose to 4-5th digits or base of 5th digit, MP flexion 50*, IP flex 40*  UPPER EXTREMITY MMT:   n/a  HAND FUNCTION: Grip strength: Right: 30 lbs; Left: unable due to pain 8/10  lbs, Lateral pinch: Right: 16 lbs,  Left: NOT tested due to pain lbs, 3 point pinch: Right: 10 lbs, Left: NOT tested due to pain  lbs, and Tip pinch: Right 11 lbs, Left: NOT tested due to pain  lbs  COORDINATION: Able to pick up pencil and coin with L hand with min incr time and incr time for manipulating coins in hand  SENSATION: Denies sensation changes, numbness or tingling.  EDEMA: none noted  COGNITION: Overall cognitive status: Within functional limits for tasks assessed  OBSERVATIONS: Pleasant, cooperative.   TREATMENT DATE: 06/01/24-Pt arrived late US  , 0.8 w/cm2, 20% to left volar and dorsal thumb and thenar eminence, no adverse reactions. A/ROM thumb flexion, IP flexion then composite thumb flexion with place and hold and passive stretch to thumb compositely then for passive IP flexion, min v.c and facilitation Finger thumb opposition to all digits. Digi-flex light reistance for composite finger flexion x 10 reps Graded clothespins 1-8# placing with tip and 3 pt pinch then removing with lateral pinch, min difficulty.  05/17/24-US  , 0.8 w/cm2, 20% to left thumb and thenar eminence, no adverse reactions. A/ROM thumb flexion, IP flexion then composite thumb flexion with place and hold, min v.c and facilitation Finger thumb opposition to all digits. Pt was instructed in red putty HEP for composite grip then tip pinch, min v.c and demonstration.Pt also performed lateral pinch, x 10 reps however this was not issued for home. thumb MP flexion 55*, IP flexion 50 today. Gentle soft tissue and joint mobs to left thumb. Ice probe to left volar thumb and thenar eminence x 4 mins for pain and stiffness. Pt was instructed that she can use and ice pack at home for 5-8 mins. 6/3/25Paraffin x to L hand with no adverse reactions for pain/stiffness. Reviewed previously issued HEP for A/ROM exercises 5-10 reps each,  US  , 0.8 w/cm2, 20% to left thumb and thenar eminence, no adverse reactions. Pt was instructed in  beginning P/ROM, 5 reps each, min v.c Ice pack x 5 mins to left hand and thumb, no adverse reactions.  04/29/24:  Evaluation completed today.  Paraffin x to L hand with no adverse reactions for pain/stiffness.  Pt also instructed in initial HEP.                                                                                                                                  PATIENT EDUCATION: Education details:   see above. Person educated: Patient Education method: Explanation, Demonstration, Verbal cues,  Education comprehension: verbalized understanding,  returned demonstration, and verbal cues required  HOME EXERCISE PROGRAM: 04/29/24:  HEP for AROM  GOALS: Goals reviewed with patient? Yes  SHORT TERM GOALS: Target date: 05/30/24  Pt will be independent with initial HEP for ROM. Baseline: no HEP Goal status: met, 05/17/24  2.  Pt will demo at least 70* L thumb MP flex for gripping objects. Baseline: 50* Goal status:  ongoing 55*  06/01/24  3.  Pt will demo at least 65* L thumb IP flex for gripping objects. Baseline: 40* Goal status:  ongoing 50*05/31/24  4.  Pt will be able to oppose to 5th digit for incr ease with in-hand manipulation. Baseline:  only able to oppose to digits 2-3 and pain Goal status: met 05/17/24   LONG TERM GOALS: Target date: 06/29/24  Pt will be independent with strengthening HEP. Baseline: no HEP Goal status: INITIAL  2.  Pt will be able to perform thumb flex to touch base of 5th digit for incr ease with in-hand manipulation. Baseline: only able to oppose to digits 2-3 and pain Goal status: INITIAL  3.  Pt will improve L hand functional use and pain as for ADLs/IADLs as shown by improving Quick Dash score to 10% or less Baseline:  20% Goal status: INITIAL  4.  Pt will demo at least 20lbs L grip strength for opening containers/lifting objects. Baseline: unable due to pain (8/10 with attempt) Goal status: met 55 lbs  05/17/24   ASSESSMENT:  CLINICAL IMPRESSION:Pt is progressing towards goals. She demonstrates improving strength and decreased pain. Pt ROM remains limited.PERFORMANCE DEFICITS: in functional skills including ADLs, IADLs, coordination, dexterity, ROM, strength, pain, Fine motor control, and UE functional use.   IMPAIRMENTS: are limiting patient from ADLs, IADLs, work, and leisure.   COMORBIDITIES: has no other co-morbidities that affects occupational performance. Patient will benefit from skilled OT to address above impairments and improve overall function.  MODIFICATION OR ASSISTANCE TO COMPLETE EVALUATION: No modification of tasks or assist necessary to complete an evaluation.  OT OCCUPATIONAL PROFILE AND HISTORY: Detailed assessment: Review of records and additional review of physical, cognitive, psychosocial history related to current functional performance.  CLINICAL DECISION MAKING: LOW - limited treatment options, no task modification necessary  REHAB POTENTIAL: Good  EVALUATION COMPLEXITY: Low      PLAN:  OT FREQUENCY: 1x/week  OT DURATION: 8 weeks +eval  PLANNED INTERVENTIONS: 97535 self care/ADL training, 02889 therapeutic exercise, 97530 therapeutic activity, 97112 neuromuscular re-education, 97140 manual therapy, 97035 ultrasound, 97018 paraffin, 02989 moist heat, 97010 cryotherapy, and 97034 contrast bath  RECOMMENDED OTHER SERVICES: none at this time  CONSULTED AND AGREED WITH PLAN OF CARE: Patient  PLAN FOR NEXT SESSION:  check on putty HEP, ROM, work towards long term goals, consider adding more visits? Marletta Bousquet, OTR/L 06/08/2024, 3:56 PM

## 2024-06-09 ENCOUNTER — Ambulatory Visit: Payer: Self-pay | Admitting: Occupational Therapy

## 2024-10-03 ENCOUNTER — Encounter: Payer: Self-pay | Admitting: Radiology
